# Patient Record
Sex: Female | Born: 1935
Health system: Southern US, Community
[De-identification: ages and names within clinical notes are randomized; demographics above are authoritative.]

## PROBLEM LIST (undated history)

## (undated) DIAGNOSIS — I10 Essential (primary) hypertension: Secondary | ICD-10-CM

## (undated) DIAGNOSIS — E119 Type 2 diabetes mellitus without complications: Secondary | ICD-10-CM

---

## 2016-10-28 DIAGNOSIS — E785 Hyperlipidemia, unspecified: Secondary | ICD-10-CM | POA: Insufficient documentation

## 2016-10-28 DIAGNOSIS — I10 Essential (primary) hypertension: Secondary | ICD-10-CM | POA: Insufficient documentation

## 2016-10-28 DIAGNOSIS — Z8673 Personal history of transient ischemic attack (TIA), and cerebral infarction without residual deficits: Secondary | ICD-10-CM | POA: Insufficient documentation

## 2016-11-11 DIAGNOSIS — E1140 Type 2 diabetes mellitus with diabetic neuropathy, unspecified: Secondary | ICD-10-CM | POA: Diagnosis not present

## 2016-11-11 DIAGNOSIS — E782 Mixed hyperlipidemia: Secondary | ICD-10-CM | POA: Diagnosis not present

## 2016-11-11 DIAGNOSIS — I10 Essential (primary) hypertension: Secondary | ICD-10-CM | POA: Diagnosis not present

## 2016-11-11 DIAGNOSIS — F5102 Adjustment insomnia: Secondary | ICD-10-CM | POA: Diagnosis not present

## 2016-11-16 DIAGNOSIS — E118 Type 2 diabetes mellitus with unspecified complications: Secondary | ICD-10-CM | POA: Diagnosis not present

## 2016-12-02 DIAGNOSIS — S81802A Unspecified open wound, left lower leg, initial encounter: Secondary | ICD-10-CM | POA: Diagnosis not present

## 2016-12-02 DIAGNOSIS — H6121 Impacted cerumen, right ear: Secondary | ICD-10-CM | POA: Diagnosis not present

## 2016-12-02 DIAGNOSIS — E1140 Type 2 diabetes mellitus with diabetic neuropathy, unspecified: Secondary | ICD-10-CM | POA: Diagnosis not present

## 2016-12-04 DIAGNOSIS — D485 Neoplasm of uncertain behavior of skin: Secondary | ICD-10-CM | POA: Diagnosis not present

## 2016-12-05 DIAGNOSIS — Z09 Encounter for follow-up examination after completed treatment for conditions other than malignant neoplasm: Secondary | ICD-10-CM | POA: Insufficient documentation

## 2016-12-05 DIAGNOSIS — C44729 Squamous cell carcinoma of skin of left lower limb, including hip: Secondary | ICD-10-CM | POA: Diagnosis not present

## 2016-12-11 DIAGNOSIS — C44729 Squamous cell carcinoma of skin of left lower limb, including hip: Secondary | ICD-10-CM | POA: Insufficient documentation

## 2016-12-20 DIAGNOSIS — K5909 Other constipation: Secondary | ICD-10-CM | POA: Diagnosis not present

## 2016-12-20 DIAGNOSIS — S81802A Unspecified open wound, left lower leg, initial encounter: Secondary | ICD-10-CM | POA: Diagnosis not present

## 2016-12-20 DIAGNOSIS — R21 Rash and other nonspecific skin eruption: Secondary | ICD-10-CM | POA: Diagnosis not present

## 2016-12-20 DIAGNOSIS — Z1211 Encounter for screening for malignant neoplasm of colon: Secondary | ICD-10-CM | POA: Diagnosis not present

## 2016-12-26 DIAGNOSIS — E782 Mixed hyperlipidemia: Secondary | ICD-10-CM | POA: Diagnosis not present

## 2016-12-26 DIAGNOSIS — M79676 Pain in unspecified toe(s): Secondary | ICD-10-CM | POA: Diagnosis not present

## 2016-12-26 DIAGNOSIS — E1140 Type 2 diabetes mellitus with diabetic neuropathy, unspecified: Secondary | ICD-10-CM | POA: Diagnosis not present

## 2017-01-08 DIAGNOSIS — C44729 Squamous cell carcinoma of skin of left lower limb, including hip: Secondary | ICD-10-CM | POA: Diagnosis not present

## 2017-01-20 DIAGNOSIS — R198 Other specified symptoms and signs involving the digestive system and abdomen: Secondary | ICD-10-CM | POA: Diagnosis not present

## 2017-01-20 DIAGNOSIS — E1136 Type 2 diabetes mellitus with diabetic cataract: Secondary | ICD-10-CM | POA: Diagnosis not present

## 2017-01-20 DIAGNOSIS — Z1339 Encounter for screening examination for other mental health and behavioral disorders: Secondary | ICD-10-CM | POA: Diagnosis not present

## 2017-01-20 DIAGNOSIS — T8130XA Disruption of wound, unspecified, initial encounter: Secondary | ICD-10-CM | POA: Diagnosis not present

## 2017-01-27 DIAGNOSIS — E1140 Type 2 diabetes mellitus with diabetic neuropathy, unspecified: Secondary | ICD-10-CM | POA: Diagnosis not present

## 2017-01-27 DIAGNOSIS — T8189XA Other complications of procedures, not elsewhere classified, initial encounter: Secondary | ICD-10-CM | POA: Diagnosis not present

## 2017-01-27 DIAGNOSIS — M170 Bilateral primary osteoarthritis of knee: Secondary | ICD-10-CM | POA: Diagnosis not present

## 2017-01-27 DIAGNOSIS — I10 Essential (primary) hypertension: Secondary | ICD-10-CM | POA: Diagnosis not present

## 2017-01-27 DIAGNOSIS — K219 Gastro-esophageal reflux disease without esophagitis: Secondary | ICD-10-CM | POA: Diagnosis not present

## 2017-01-27 DIAGNOSIS — R198 Other specified symptoms and signs involving the digestive system and abdomen: Secondary | ICD-10-CM | POA: Diagnosis not present

## 2017-01-27 DIAGNOSIS — T8130XA Disruption of wound, unspecified, initial encounter: Secondary | ICD-10-CM | POA: Diagnosis not present

## 2017-01-27 DIAGNOSIS — C441292 Squamous cell carcinoma of skin of left lower eyelid, including canthus: Secondary | ICD-10-CM | POA: Diagnosis not present

## 2017-01-27 DIAGNOSIS — Z8673 Personal history of transient ischemic attack (TIA), and cerebral infarction without residual deficits: Secondary | ICD-10-CM | POA: Diagnosis not present

## 2017-01-27 DIAGNOSIS — S81802A Unspecified open wound, left lower leg, initial encounter: Secondary | ICD-10-CM | POA: Diagnosis not present

## 2017-01-30 DIAGNOSIS — L570 Actinic keratosis: Secondary | ICD-10-CM | POA: Diagnosis not present

## 2017-01-30 DIAGNOSIS — C44519 Basal cell carcinoma of skin of other part of trunk: Secondary | ICD-10-CM | POA: Diagnosis not present

## 2017-01-30 DIAGNOSIS — L400 Psoriasis vulgaris: Secondary | ICD-10-CM | POA: Diagnosis not present

## 2017-02-03 DIAGNOSIS — Z48817 Encounter for surgical aftercare following surgery on the skin and subcutaneous tissue: Secondary | ICD-10-CM | POA: Diagnosis not present

## 2017-02-03 DIAGNOSIS — Z85828 Personal history of other malignant neoplasm of skin: Secondary | ICD-10-CM | POA: Diagnosis not present

## 2017-02-03 DIAGNOSIS — H9209 Otalgia, unspecified ear: Secondary | ICD-10-CM | POA: Diagnosis not present

## 2017-02-03 DIAGNOSIS — S81802A Unspecified open wound, left lower leg, initial encounter: Secondary | ICD-10-CM | POA: Diagnosis not present

## 2017-02-03 DIAGNOSIS — H6120 Impacted cerumen, unspecified ear: Secondary | ICD-10-CM | POA: Diagnosis not present

## 2017-02-03 DIAGNOSIS — H61891 Other specified disorders of right external ear: Secondary | ICD-10-CM | POA: Diagnosis not present

## 2017-02-03 DIAGNOSIS — I872 Venous insufficiency (chronic) (peripheral): Secondary | ICD-10-CM | POA: Diagnosis not present

## 2017-02-03 DIAGNOSIS — L97822 Non-pressure chronic ulcer of other part of left lower leg with fat layer exposed: Secondary | ICD-10-CM | POA: Diagnosis not present

## 2017-02-03 DIAGNOSIS — Z4889 Encounter for other specified surgical aftercare: Secondary | ICD-10-CM | POA: Diagnosis not present

## 2017-02-13 DIAGNOSIS — L821 Other seborrheic keratosis: Secondary | ICD-10-CM | POA: Diagnosis not present

## 2017-02-13 DIAGNOSIS — C44519 Basal cell carcinoma of skin of other part of trunk: Secondary | ICD-10-CM | POA: Diagnosis not present

## 2017-02-13 DIAGNOSIS — D485 Neoplasm of uncertain behavior of skin: Secondary | ICD-10-CM | POA: Diagnosis not present

## 2017-02-15 DIAGNOSIS — E118 Type 2 diabetes mellitus with unspecified complications: Secondary | ICD-10-CM | POA: Diagnosis not present

## 2017-02-17 DIAGNOSIS — H43813 Vitreous degeneration, bilateral: Secondary | ICD-10-CM | POA: Diagnosis not present

## 2017-02-17 DIAGNOSIS — E113293 Type 2 diabetes mellitus with mild nonproliferative diabetic retinopathy without macular edema, bilateral: Secondary | ICD-10-CM | POA: Diagnosis not present

## 2017-02-19 DIAGNOSIS — C44519 Basal cell carcinoma of skin of other part of trunk: Secondary | ICD-10-CM | POA: Diagnosis not present

## 2017-02-19 DIAGNOSIS — S2190XA Unspecified open wound of unspecified part of thorax, initial encounter: Secondary | ICD-10-CM | POA: Diagnosis not present

## 2017-02-27 DIAGNOSIS — C44329 Squamous cell carcinoma of skin of other parts of face: Secondary | ICD-10-CM | POA: Diagnosis not present

## 2017-02-27 DIAGNOSIS — L821 Other seborrheic keratosis: Secondary | ICD-10-CM | POA: Diagnosis not present

## 2017-02-27 DIAGNOSIS — L720 Epidermal cyst: Secondary | ICD-10-CM | POA: Diagnosis not present

## 2017-02-27 DIAGNOSIS — L570 Actinic keratosis: Secondary | ICD-10-CM | POA: Diagnosis not present

## 2017-02-27 DIAGNOSIS — D485 Neoplasm of uncertain behavior of skin: Secondary | ICD-10-CM | POA: Diagnosis not present

## 2017-02-27 DIAGNOSIS — L853 Xerosis cutis: Secondary | ICD-10-CM | POA: Diagnosis not present

## 2017-02-27 DIAGNOSIS — L579 Skin changes due to chronic exposure to nonionizing radiation, unspecified: Secondary | ICD-10-CM | POA: Diagnosis not present

## 2017-02-27 DIAGNOSIS — D0330 Melanoma in situ of unspecified part of face: Secondary | ICD-10-CM | POA: Diagnosis not present

## 2017-03-11 DIAGNOSIS — G459 Transient cerebral ischemic attack, unspecified: Secondary | ICD-10-CM | POA: Diagnosis not present

## 2017-03-11 DIAGNOSIS — R609 Edema, unspecified: Secondary | ICD-10-CM | POA: Diagnosis not present

## 2017-03-11 DIAGNOSIS — E781 Pure hyperglyceridemia: Secondary | ICD-10-CM | POA: Diagnosis not present

## 2017-03-13 DIAGNOSIS — C44320 Squamous cell carcinoma of skin of unspecified parts of face: Secondary | ICD-10-CM | POA: Diagnosis not present

## 2017-03-13 DIAGNOSIS — C44329 Squamous cell carcinoma of skin of other parts of face: Secondary | ICD-10-CM | POA: Diagnosis not present

## 2017-03-13 DIAGNOSIS — C4442 Squamous cell carcinoma of skin of scalp and neck: Secondary | ICD-10-CM | POA: Diagnosis not present

## 2017-03-21 DIAGNOSIS — D0339 Melanoma in situ of other parts of face: Secondary | ICD-10-CM | POA: Diagnosis not present

## 2017-03-25 DIAGNOSIS — C4339 Malignant melanoma of other parts of face: Secondary | ICD-10-CM | POA: Diagnosis not present

## 2017-03-25 DIAGNOSIS — D0339 Melanoma in situ of other parts of face: Secondary | ICD-10-CM | POA: Diagnosis not present

## 2017-04-01 DIAGNOSIS — T7840XA Allergy, unspecified, initial encounter: Secondary | ICD-10-CM | POA: Diagnosis not present

## 2017-04-01 DIAGNOSIS — I160 Hypertensive urgency: Secondary | ICD-10-CM | POA: Diagnosis not present

## 2017-04-01 DIAGNOSIS — E1165 Type 2 diabetes mellitus with hyperglycemia: Secondary | ICD-10-CM | POA: Diagnosis not present

## 2017-04-01 DIAGNOSIS — E782 Mixed hyperlipidemia: Secondary | ICD-10-CM | POA: Diagnosis not present

## 2017-04-01 DIAGNOSIS — R079 Chest pain, unspecified: Secondary | ICD-10-CM | POA: Diagnosis not present

## 2017-04-01 DIAGNOSIS — Z79899 Other long term (current) drug therapy: Secondary | ICD-10-CM | POA: Diagnosis not present

## 2017-04-01 DIAGNOSIS — R9082 White matter disease, unspecified: Secondary | ICD-10-CM | POA: Diagnosis not present

## 2017-04-01 DIAGNOSIS — I10 Essential (primary) hypertension: Secondary | ICD-10-CM | POA: Diagnosis not present

## 2017-04-01 DIAGNOSIS — R51 Headache: Secondary | ICD-10-CM | POA: Diagnosis not present

## 2017-04-03 DIAGNOSIS — R011 Cardiac murmur, unspecified: Secondary | ICD-10-CM | POA: Diagnosis not present

## 2017-04-03 DIAGNOSIS — R9431 Abnormal electrocardiogram [ECG] [EKG]: Secondary | ICD-10-CM | POA: Diagnosis not present

## 2017-04-03 DIAGNOSIS — E119 Type 2 diabetes mellitus without complications: Secondary | ICD-10-CM | POA: Diagnosis not present

## 2017-04-03 DIAGNOSIS — I10 Essential (primary) hypertension: Secondary | ICD-10-CM | POA: Diagnosis not present

## 2017-04-03 DIAGNOSIS — E7800 Pure hypercholesterolemia, unspecified: Secondary | ICD-10-CM | POA: Diagnosis not present

## 2017-04-17 DIAGNOSIS — M79606 Pain in leg, unspecified: Secondary | ICD-10-CM | POA: Diagnosis not present

## 2017-04-17 DIAGNOSIS — R011 Cardiac murmur, unspecified: Secondary | ICD-10-CM | POA: Diagnosis not present

## 2017-04-17 DIAGNOSIS — I739 Peripheral vascular disease, unspecified: Secondary | ICD-10-CM | POA: Diagnosis not present

## 2017-04-17 DIAGNOSIS — R0989 Other specified symptoms and signs involving the circulatory and respiratory systems: Secondary | ICD-10-CM | POA: Diagnosis not present

## 2017-04-18 DIAGNOSIS — E7800 Pure hypercholesterolemia, unspecified: Secondary | ICD-10-CM | POA: Diagnosis not present

## 2017-04-18 DIAGNOSIS — I10 Essential (primary) hypertension: Secondary | ICD-10-CM | POA: Diagnosis not present

## 2017-04-18 DIAGNOSIS — L400 Psoriasis vulgaris: Secondary | ICD-10-CM | POA: Diagnosis not present

## 2017-04-18 DIAGNOSIS — D485 Neoplasm of uncertain behavior of skin: Secondary | ICD-10-CM | POA: Diagnosis not present

## 2017-04-18 DIAGNOSIS — R011 Cardiac murmur, unspecified: Secondary | ICD-10-CM | POA: Diagnosis not present

## 2017-04-18 DIAGNOSIS — R9431 Abnormal electrocardiogram [ECG] [EKG]: Secondary | ICD-10-CM | POA: Diagnosis not present

## 2017-04-18 DIAGNOSIS — E119 Type 2 diabetes mellitus without complications: Secondary | ICD-10-CM | POA: Diagnosis not present

## 2017-04-18 DIAGNOSIS — L821 Other seborrheic keratosis: Secondary | ICD-10-CM | POA: Diagnosis not present

## 2017-04-18 DIAGNOSIS — C44729 Squamous cell carcinoma of skin of left lower limb, including hip: Secondary | ICD-10-CM | POA: Diagnosis not present

## 2017-05-01 DIAGNOSIS — C44729 Squamous cell carcinoma of skin of left lower limb, including hip: Secondary | ICD-10-CM | POA: Diagnosis not present

## 2017-05-12 DIAGNOSIS — Z79899 Other long term (current) drug therapy: Secondary | ICD-10-CM | POA: Diagnosis not present

## 2017-05-12 DIAGNOSIS — R05 Cough: Secondary | ICD-10-CM | POA: Diagnosis not present

## 2017-05-12 DIAGNOSIS — I10 Essential (primary) hypertension: Secondary | ICD-10-CM | POA: Diagnosis not present

## 2017-05-12 DIAGNOSIS — Z794 Long term (current) use of insulin: Secondary | ICD-10-CM | POA: Diagnosis not present

## 2017-05-12 DIAGNOSIS — R531 Weakness: Secondary | ICD-10-CM | POA: Diagnosis not present

## 2017-05-12 DIAGNOSIS — R079 Chest pain, unspecified: Secondary | ICD-10-CM | POA: Diagnosis not present

## 2017-05-12 DIAGNOSIS — E119 Type 2 diabetes mellitus without complications: Secondary | ICD-10-CM | POA: Diagnosis not present

## 2017-05-15 DIAGNOSIS — E7800 Pure hypercholesterolemia, unspecified: Secondary | ICD-10-CM | POA: Diagnosis not present

## 2017-05-15 DIAGNOSIS — R194 Change in bowel habit: Secondary | ICD-10-CM | POA: Diagnosis not present

## 2017-05-15 DIAGNOSIS — I10 Essential (primary) hypertension: Secondary | ICD-10-CM | POA: Diagnosis not present

## 2017-05-15 DIAGNOSIS — R011 Cardiac murmur, unspecified: Secondary | ICD-10-CM | POA: Diagnosis not present

## 2017-05-15 DIAGNOSIS — K219 Gastro-esophageal reflux disease without esophagitis: Secondary | ICD-10-CM | POA: Diagnosis not present

## 2017-05-15 DIAGNOSIS — K5909 Other constipation: Secondary | ICD-10-CM | POA: Diagnosis not present

## 2017-05-15 DIAGNOSIS — K9289 Other specified diseases of the digestive system: Secondary | ICD-10-CM | POA: Diagnosis not present

## 2017-05-15 DIAGNOSIS — R9431 Abnormal electrocardiogram [ECG] [EKG]: Secondary | ICD-10-CM | POA: Diagnosis not present

## 2017-05-15 DIAGNOSIS — R112 Nausea with vomiting, unspecified: Secondary | ICD-10-CM | POA: Diagnosis not present

## 2017-05-15 DIAGNOSIS — M79606 Pain in leg, unspecified: Secondary | ICD-10-CM | POA: Diagnosis not present

## 2017-05-16 DIAGNOSIS — R109 Unspecified abdominal pain: Secondary | ICD-10-CM | POA: Diagnosis not present

## 2017-05-16 DIAGNOSIS — K2970 Gastritis, unspecified, without bleeding: Secondary | ICD-10-CM | POA: Diagnosis not present

## 2017-05-16 DIAGNOSIS — K2900 Acute gastritis without bleeding: Secondary | ICD-10-CM | POA: Diagnosis not present

## 2017-05-16 DIAGNOSIS — I10 Essential (primary) hypertension: Secondary | ICD-10-CM | POA: Diagnosis not present

## 2017-05-16 DIAGNOSIS — E119 Type 2 diabetes mellitus without complications: Secondary | ICD-10-CM | POA: Diagnosis not present

## 2017-05-16 DIAGNOSIS — Z794 Long term (current) use of insulin: Secondary | ICD-10-CM | POA: Diagnosis not present

## 2017-05-16 DIAGNOSIS — N200 Calculus of kidney: Secondary | ICD-10-CM | POA: Diagnosis not present

## 2017-05-17 DIAGNOSIS — E118 Type 2 diabetes mellitus with unspecified complications: Secondary | ICD-10-CM | POA: Diagnosis not present

## 2017-05-26 DIAGNOSIS — L400 Psoriasis vulgaris: Secondary | ICD-10-CM | POA: Diagnosis not present

## 2017-05-26 DIAGNOSIS — L570 Actinic keratosis: Secondary | ICD-10-CM | POA: Diagnosis not present

## 2017-06-14 DIAGNOSIS — R42 Dizziness and giddiness: Secondary | ICD-10-CM | POA: Diagnosis not present

## 2017-06-14 DIAGNOSIS — E119 Type 2 diabetes mellitus without complications: Secondary | ICD-10-CM | POA: Diagnosis not present

## 2017-06-14 DIAGNOSIS — I10 Essential (primary) hypertension: Secondary | ICD-10-CM | POA: Diagnosis not present

## 2017-06-14 DIAGNOSIS — Z794 Long term (current) use of insulin: Secondary | ICD-10-CM | POA: Diagnosis not present

## 2017-06-14 DIAGNOSIS — R350 Frequency of micturition: Secondary | ICD-10-CM | POA: Diagnosis not present

## 2017-06-14 DIAGNOSIS — Z79899 Other long term (current) drug therapy: Secondary | ICD-10-CM | POA: Diagnosis not present

## 2017-06-25 DIAGNOSIS — R198 Other specified symptoms and signs involving the digestive system and abdomen: Secondary | ICD-10-CM | POA: Diagnosis not present

## 2017-06-25 DIAGNOSIS — M5431 Sciatica, right side: Secondary | ICD-10-CM | POA: Diagnosis not present

## 2017-06-25 DIAGNOSIS — L989 Disorder of the skin and subcutaneous tissue, unspecified: Secondary | ICD-10-CM | POA: Diagnosis not present

## 2017-06-25 DIAGNOSIS — L409 Psoriasis, unspecified: Secondary | ICD-10-CM | POA: Diagnosis not present

## 2017-06-25 DIAGNOSIS — K921 Melena: Secondary | ICD-10-CM | POA: Diagnosis not present

## 2017-07-01 DIAGNOSIS — L400 Psoriasis vulgaris: Secondary | ICD-10-CM | POA: Diagnosis not present

## 2017-07-01 DIAGNOSIS — C44529 Squamous cell carcinoma of skin of other part of trunk: Secondary | ICD-10-CM | POA: Diagnosis not present

## 2017-07-01 DIAGNOSIS — D045 Carcinoma in situ of skin of trunk: Secondary | ICD-10-CM | POA: Diagnosis not present

## 2017-07-01 DIAGNOSIS — L821 Other seborrheic keratosis: Secondary | ICD-10-CM | POA: Diagnosis not present

## 2017-07-08 DIAGNOSIS — Z1211 Encounter for screening for malignant neoplasm of colon: Secondary | ICD-10-CM | POA: Diagnosis not present

## 2017-07-09 DIAGNOSIS — I10 Essential (primary) hypertension: Secondary | ICD-10-CM | POA: Diagnosis not present

## 2017-07-09 DIAGNOSIS — K529 Noninfective gastroenteritis and colitis, unspecified: Secondary | ICD-10-CM | POA: Diagnosis not present

## 2017-07-09 DIAGNOSIS — E1165 Type 2 diabetes mellitus with hyperglycemia: Secondary | ICD-10-CM | POA: Diagnosis not present

## 2017-07-10 DIAGNOSIS — K529 Noninfective gastroenteritis and colitis, unspecified: Secondary | ICD-10-CM | POA: Diagnosis not present

## 2017-07-22 DIAGNOSIS — R197 Diarrhea, unspecified: Secondary | ICD-10-CM | POA: Diagnosis not present

## 2017-07-23 DIAGNOSIS — R197 Diarrhea, unspecified: Secondary | ICD-10-CM | POA: Diagnosis not present

## 2017-07-30 DIAGNOSIS — D045 Carcinoma in situ of skin of trunk: Secondary | ICD-10-CM | POA: Diagnosis not present

## 2017-08-08 DIAGNOSIS — M1712 Unilateral primary osteoarthritis, left knee: Secondary | ICD-10-CM | POA: Diagnosis not present

## 2017-08-08 DIAGNOSIS — T8189XA Other complications of procedures, not elsewhere classified, initial encounter: Secondary | ICD-10-CM | POA: Diagnosis not present

## 2017-08-08 DIAGNOSIS — I10 Essential (primary) hypertension: Secondary | ICD-10-CM | POA: Diagnosis not present

## 2017-08-08 DIAGNOSIS — G629 Polyneuropathy, unspecified: Secondary | ICD-10-CM | POA: Diagnosis not present

## 2017-08-12 DIAGNOSIS — Z85820 Personal history of malignant melanoma of skin: Secondary | ICD-10-CM | POA: Diagnosis not present

## 2017-08-12 DIAGNOSIS — C44329 Squamous cell carcinoma of skin of other parts of face: Secondary | ICD-10-CM | POA: Diagnosis not present

## 2017-08-12 DIAGNOSIS — L570 Actinic keratosis: Secondary | ICD-10-CM | POA: Diagnosis not present

## 2017-08-12 DIAGNOSIS — L821 Other seborrheic keratosis: Secondary | ICD-10-CM | POA: Diagnosis not present

## 2017-08-12 DIAGNOSIS — D045 Carcinoma in situ of skin of trunk: Secondary | ICD-10-CM | POA: Diagnosis not present

## 2017-08-14 DIAGNOSIS — M1712 Unilateral primary osteoarthritis, left knee: Secondary | ICD-10-CM | POA: Diagnosis not present

## 2017-08-16 DIAGNOSIS — E118 Type 2 diabetes mellitus with unspecified complications: Secondary | ICD-10-CM | POA: Diagnosis not present

## 2017-08-27 DIAGNOSIS — M25562 Pain in left knee: Secondary | ICD-10-CM | POA: Diagnosis not present

## 2017-08-27 DIAGNOSIS — R2689 Other abnormalities of gait and mobility: Secondary | ICD-10-CM | POA: Diagnosis not present

## 2017-08-27 DIAGNOSIS — M1712 Unilateral primary osteoarthritis, left knee: Secondary | ICD-10-CM | POA: Diagnosis not present

## 2017-09-15 DIAGNOSIS — E1165 Type 2 diabetes mellitus with hyperglycemia: Secondary | ICD-10-CM | POA: Diagnosis not present

## 2017-09-18 DIAGNOSIS — M1712 Unilateral primary osteoarthritis, left knee: Secondary | ICD-10-CM | POA: Diagnosis not present

## 2017-10-06 DIAGNOSIS — N3000 Acute cystitis without hematuria: Secondary | ICD-10-CM | POA: Diagnosis not present

## 2017-10-21 DIAGNOSIS — H25812 Combined forms of age-related cataract, left eye: Secondary | ICD-10-CM | POA: Diagnosis not present

## 2017-10-22 DIAGNOSIS — J9811 Atelectasis: Secondary | ICD-10-CM | POA: Diagnosis not present

## 2017-10-22 DIAGNOSIS — E119 Type 2 diabetes mellitus without complications: Secondary | ICD-10-CM | POA: Diagnosis not present

## 2017-10-22 DIAGNOSIS — R7309 Other abnormal glucose: Secondary | ICD-10-CM | POA: Diagnosis not present

## 2017-10-22 DIAGNOSIS — R7989 Other specified abnormal findings of blood chemistry: Secondary | ICD-10-CM | POA: Diagnosis not present

## 2017-10-22 DIAGNOSIS — I700 Atherosclerosis of aorta: Secondary | ICD-10-CM | POA: Diagnosis not present

## 2017-10-22 DIAGNOSIS — I10 Essential (primary) hypertension: Secondary | ICD-10-CM | POA: Diagnosis not present

## 2017-10-22 DIAGNOSIS — R251 Tremor, unspecified: Secondary | ICD-10-CM | POA: Diagnosis not present

## 2017-10-22 DIAGNOSIS — R252 Cramp and spasm: Secondary | ICD-10-CM | POA: Diagnosis not present

## 2017-10-23 DIAGNOSIS — R7989 Other specified abnormal findings of blood chemistry: Secondary | ICD-10-CM | POA: Diagnosis not present

## 2017-10-27 DIAGNOSIS — G373 Acute transverse myelitis in demyelinating disease of central nervous system: Secondary | ICD-10-CM | POA: Diagnosis not present

## 2017-10-27 DIAGNOSIS — E1165 Type 2 diabetes mellitus with hyperglycemia: Secondary | ICD-10-CM | POA: Diagnosis not present

## 2017-10-27 DIAGNOSIS — I10 Essential (primary) hypertension: Secondary | ICD-10-CM | POA: Diagnosis not present

## 2017-11-14 DIAGNOSIS — N3000 Acute cystitis without hematuria: Secondary | ICD-10-CM | POA: Diagnosis not present

## 2017-11-14 DIAGNOSIS — Z79899 Other long term (current) drug therapy: Secondary | ICD-10-CM | POA: Diagnosis not present

## 2017-11-14 DIAGNOSIS — E7800 Pure hypercholesterolemia, unspecified: Secondary | ICD-10-CM | POA: Diagnosis not present

## 2017-11-14 DIAGNOSIS — D510 Vitamin B12 deficiency anemia due to intrinsic factor deficiency: Secondary | ICD-10-CM | POA: Diagnosis not present

## 2017-11-14 DIAGNOSIS — E1165 Type 2 diabetes mellitus with hyperglycemia: Secondary | ICD-10-CM | POA: Diagnosis not present

## 2017-11-19 DIAGNOSIS — M48061 Spinal stenosis, lumbar region without neurogenic claudication: Secondary | ICD-10-CM | POA: Diagnosis not present

## 2017-11-19 DIAGNOSIS — N3000 Acute cystitis without hematuria: Secondary | ICD-10-CM | POA: Diagnosis not present

## 2017-11-19 DIAGNOSIS — G373 Acute transverse myelitis in demyelinating disease of central nervous system: Secondary | ICD-10-CM | POA: Diagnosis not present

## 2017-11-19 DIAGNOSIS — M4804 Spinal stenosis, thoracic region: Secondary | ICD-10-CM | POA: Diagnosis not present

## 2017-11-19 DIAGNOSIS — E1165 Type 2 diabetes mellitus with hyperglycemia: Secondary | ICD-10-CM | POA: Diagnosis not present

## 2017-11-19 DIAGNOSIS — M5126 Other intervertebral disc displacement, lumbar region: Secondary | ICD-10-CM | POA: Diagnosis not present

## 2017-11-19 DIAGNOSIS — M47814 Spondylosis without myelopathy or radiculopathy, thoracic region: Secondary | ICD-10-CM | POA: Diagnosis not present

## 2017-11-19 DIAGNOSIS — M542 Cervicalgia: Secondary | ICD-10-CM | POA: Diagnosis not present

## 2017-11-20 DIAGNOSIS — C44622 Squamous cell carcinoma of skin of right upper limb, including shoulder: Secondary | ICD-10-CM | POA: Diagnosis not present

## 2017-11-20 DIAGNOSIS — L400 Psoriasis vulgaris: Secondary | ICD-10-CM | POA: Diagnosis not present

## 2017-11-20 DIAGNOSIS — L821 Other seborrheic keratosis: Secondary | ICD-10-CM | POA: Diagnosis not present

## 2017-11-20 DIAGNOSIS — L820 Inflamed seborrheic keratosis: Secondary | ICD-10-CM | POA: Diagnosis not present

## 2017-11-20 DIAGNOSIS — L578 Other skin changes due to chronic exposure to nonionizing radiation: Secondary | ICD-10-CM | POA: Diagnosis not present

## 2017-11-24 DIAGNOSIS — M48061 Spinal stenosis, lumbar region without neurogenic claudication: Secondary | ICD-10-CM | POA: Diagnosis not present

## 2017-11-24 DIAGNOSIS — M9981 Other biomechanical lesions of cervical region: Secondary | ICD-10-CM | POA: Diagnosis not present

## 2017-11-24 DIAGNOSIS — E1165 Type 2 diabetes mellitus with hyperglycemia: Secondary | ICD-10-CM | POA: Diagnosis not present

## 2017-12-16 DIAGNOSIS — M4316 Spondylolisthesis, lumbar region: Secondary | ICD-10-CM | POA: Diagnosis not present

## 2017-12-16 DIAGNOSIS — M48062 Spinal stenosis, lumbar region with neurogenic claudication: Secondary | ICD-10-CM | POA: Diagnosis not present

## 2017-12-16 DIAGNOSIS — M4802 Spinal stenosis, cervical region: Secondary | ICD-10-CM | POA: Diagnosis not present

## 2017-12-16 DIAGNOSIS — M4726 Other spondylosis with radiculopathy, lumbar region: Secondary | ICD-10-CM | POA: Diagnosis not present

## 2017-12-24 DIAGNOSIS — E1165 Type 2 diabetes mellitus with hyperglycemia: Secondary | ICD-10-CM | POA: Diagnosis not present

## 2017-12-24 DIAGNOSIS — L4050 Arthropathic psoriasis, unspecified: Secondary | ICD-10-CM | POA: Diagnosis not present

## 2017-12-24 DIAGNOSIS — M48061 Spinal stenosis, lumbar region without neurogenic claudication: Secondary | ICD-10-CM | POA: Diagnosis not present

## 2017-12-24 DIAGNOSIS — L400 Psoriasis vulgaris: Secondary | ICD-10-CM | POA: Diagnosis not present

## 2018-01-01 DIAGNOSIS — E1165 Type 2 diabetes mellitus with hyperglycemia: Secondary | ICD-10-CM | POA: Diagnosis not present

## 2018-01-19 DIAGNOSIS — M48062 Spinal stenosis, lumbar region with neurogenic claudication: Secondary | ICD-10-CM | POA: Diagnosis not present

## 2018-01-19 DIAGNOSIS — I10 Essential (primary) hypertension: Secondary | ICD-10-CM | POA: Diagnosis not present

## 2018-01-19 DIAGNOSIS — M4804 Spinal stenosis, thoracic region: Secondary | ICD-10-CM | POA: Diagnosis not present

## 2018-01-20 DIAGNOSIS — K602 Anal fissure, unspecified: Secondary | ICD-10-CM | POA: Diagnosis not present

## 2018-01-20 DIAGNOSIS — L409 Psoriasis, unspecified: Secondary | ICD-10-CM | POA: Diagnosis not present

## 2018-02-09 DIAGNOSIS — L720 Epidermal cyst: Secondary | ICD-10-CM | POA: Diagnosis not present

## 2018-02-09 DIAGNOSIS — L400 Psoriasis vulgaris: Secondary | ICD-10-CM | POA: Diagnosis not present

## 2018-02-16 ENCOUNTER — Other Ambulatory Visit: Payer: Self-pay | Admitting: Neurological Surgery

## 2018-02-16 DIAGNOSIS — M4316 Spondylolisthesis, lumbar region: Secondary | ICD-10-CM | POA: Diagnosis not present

## 2018-02-16 DIAGNOSIS — M48062 Spinal stenosis, lumbar region with neurogenic claudication: Secondary | ICD-10-CM | POA: Diagnosis not present

## 2018-03-09 ENCOUNTER — Other Ambulatory Visit (HOSPITAL_COMMUNITY): Payer: Medicare Other

## 2018-03-13 ENCOUNTER — Ambulatory Visit: Admit: 2018-03-13 | Payer: Medicare Other | Admitting: Neurological Surgery

## 2018-03-13 SURGERY — LUMBAR LAMINECTOMY/DECOMPRESSION MICRODISCECTOMY 1 LEVEL
Anesthesia: General | Site: Back | Laterality: Bilateral

## 2018-03-23 DIAGNOSIS — E113293 Type 2 diabetes mellitus with mild nonproliferative diabetic retinopathy without macular edema, bilateral: Secondary | ICD-10-CM | POA: Diagnosis not present

## 2018-03-23 DIAGNOSIS — H2589 Other age-related cataract: Secondary | ICD-10-CM | POA: Diagnosis not present

## 2018-04-10 DIAGNOSIS — I10 Essential (primary) hypertension: Secondary | ICD-10-CM | POA: Diagnosis not present

## 2018-04-10 DIAGNOSIS — E1165 Type 2 diabetes mellitus with hyperglycemia: Secondary | ICD-10-CM | POA: Diagnosis not present

## 2018-04-10 DIAGNOSIS — R9431 Abnormal electrocardiogram [ECG] [EKG]: Secondary | ICD-10-CM | POA: Diagnosis not present

## 2018-04-16 DIAGNOSIS — Z1231 Encounter for screening mammogram for malignant neoplasm of breast: Secondary | ICD-10-CM | POA: Diagnosis not present

## 2018-04-17 DIAGNOSIS — Z85828 Personal history of other malignant neoplasm of skin: Secondary | ICD-10-CM | POA: Diagnosis not present

## 2018-04-17 DIAGNOSIS — Z85820 Personal history of malignant melanoma of skin: Secondary | ICD-10-CM | POA: Diagnosis not present

## 2018-04-17 DIAGNOSIS — L400 Psoriasis vulgaris: Secondary | ICD-10-CM | POA: Diagnosis not present

## 2018-04-22 DIAGNOSIS — N390 Urinary tract infection, site not specified: Secondary | ICD-10-CM | POA: Diagnosis not present

## 2018-04-22 DIAGNOSIS — I10 Essential (primary) hypertension: Secondary | ICD-10-CM | POA: Diagnosis not present

## 2018-04-22 DIAGNOSIS — R9431 Abnormal electrocardiogram [ECG] [EKG]: Secondary | ICD-10-CM | POA: Diagnosis not present

## 2018-04-22 DIAGNOSIS — M2550 Pain in unspecified joint: Secondary | ICD-10-CM | POA: Diagnosis not present

## 2018-04-22 DIAGNOSIS — R8290 Unspecified abnormal findings in urine: Secondary | ICD-10-CM | POA: Diagnosis not present

## 2018-04-22 DIAGNOSIS — E1165 Type 2 diabetes mellitus with hyperglycemia: Secondary | ICD-10-CM | POA: Diagnosis not present

## 2018-05-01 DIAGNOSIS — I10 Essential (primary) hypertension: Secondary | ICD-10-CM | POA: Diagnosis not present

## 2018-05-01 DIAGNOSIS — E1165 Type 2 diabetes mellitus with hyperglycemia: Secondary | ICD-10-CM | POA: Diagnosis not present

## 2018-05-07 DIAGNOSIS — M1712 Unilateral primary osteoarthritis, left knee: Secondary | ICD-10-CM | POA: Diagnosis not present

## 2018-06-02 DIAGNOSIS — E1165 Type 2 diabetes mellitus with hyperglycemia: Secondary | ICD-10-CM | POA: Diagnosis not present

## 2018-06-02 DIAGNOSIS — I10 Essential (primary) hypertension: Secondary | ICD-10-CM | POA: Diagnosis not present

## 2018-07-02 DIAGNOSIS — E785 Hyperlipidemia, unspecified: Secondary | ICD-10-CM | POA: Diagnosis not present

## 2018-07-02 DIAGNOSIS — I10 Essential (primary) hypertension: Secondary | ICD-10-CM | POA: Diagnosis not present

## 2018-07-03 DIAGNOSIS — J988 Other specified respiratory disorders: Secondary | ICD-10-CM | POA: Diagnosis not present

## 2018-07-03 DIAGNOSIS — K5900 Constipation, unspecified: Secondary | ICD-10-CM | POA: Diagnosis not present

## 2018-07-05 DIAGNOSIS — R05 Cough: Secondary | ICD-10-CM | POA: Diagnosis not present

## 2018-07-05 DIAGNOSIS — Z03818 Encounter for observation for suspected exposure to other biological agents ruled out: Secondary | ICD-10-CM | POA: Diagnosis not present

## 2018-07-08 DIAGNOSIS — H1010 Acute atopic conjunctivitis, unspecified eye: Secondary | ICD-10-CM | POA: Diagnosis not present

## 2018-07-08 DIAGNOSIS — J189 Pneumonia, unspecified organism: Secondary | ICD-10-CM | POA: Diagnosis not present

## 2018-07-08 DIAGNOSIS — J309 Allergic rhinitis, unspecified: Secondary | ICD-10-CM | POA: Diagnosis not present

## 2018-07-16 DIAGNOSIS — J189 Pneumonia, unspecified organism: Secondary | ICD-10-CM | POA: Diagnosis not present

## 2018-07-16 DIAGNOSIS — J309 Allergic rhinitis, unspecified: Secondary | ICD-10-CM | POA: Diagnosis not present

## 2018-07-16 DIAGNOSIS — R197 Diarrhea, unspecified: Secondary | ICD-10-CM | POA: Diagnosis not present

## 2018-07-16 DIAGNOSIS — H1010 Acute atopic conjunctivitis, unspecified eye: Secondary | ICD-10-CM | POA: Diagnosis not present

## 2018-07-31 DIAGNOSIS — I10 Essential (primary) hypertension: Secondary | ICD-10-CM | POA: Diagnosis not present

## 2018-07-31 DIAGNOSIS — M1990 Unspecified osteoarthritis, unspecified site: Secondary | ICD-10-CM | POA: Diagnosis not present

## 2018-07-31 DIAGNOSIS — E785 Hyperlipidemia, unspecified: Secondary | ICD-10-CM | POA: Diagnosis not present

## 2018-07-31 DIAGNOSIS — Z0000 Encounter for general adult medical examination without abnormal findings: Secondary | ICD-10-CM | POA: Diagnosis not present

## 2018-07-31 DIAGNOSIS — E1165 Type 2 diabetes mellitus with hyperglycemia: Secondary | ICD-10-CM | POA: Diagnosis not present

## 2018-08-01 DIAGNOSIS — E1165 Type 2 diabetes mellitus with hyperglycemia: Secondary | ICD-10-CM | POA: Diagnosis not present

## 2018-08-01 DIAGNOSIS — M1990 Unspecified osteoarthritis, unspecified site: Secondary | ICD-10-CM | POA: Diagnosis not present

## 2018-08-11 ENCOUNTER — Other Ambulatory Visit: Payer: Self-pay

## 2018-08-11 ENCOUNTER — Encounter: Payer: Self-pay | Admitting: Podiatry

## 2018-08-11 ENCOUNTER — Ambulatory Visit: Payer: Medicare Other | Admitting: Podiatry

## 2018-08-11 VITALS — Temp 96.6°F | Resp 16

## 2018-08-11 DIAGNOSIS — B351 Tinea unguium: Secondary | ICD-10-CM | POA: Diagnosis not present

## 2018-08-11 DIAGNOSIS — E1142 Type 2 diabetes mellitus with diabetic polyneuropathy: Secondary | ICD-10-CM | POA: Diagnosis not present

## 2018-08-11 DIAGNOSIS — M79609 Pain in unspecified limb: Secondary | ICD-10-CM | POA: Diagnosis not present

## 2018-08-11 DIAGNOSIS — E1169 Type 2 diabetes mellitus with other specified complication: Secondary | ICD-10-CM

## 2018-08-11 MED ORDER — FLUCONAZOLE 150 MG PO TABS: 150.0000 mg | ORAL_TABLET | ORAL | 1 refills | Status: AC

## 2018-08-11 NOTE — Progress Notes (Signed)
   Subjective:    Patient ID: Terri Martin, female    DOB: [birth year], 83 y.o.   MRN: [id number]  HPI    Review of Systems  All other systems reviewed and are negative.      Objective:   Physical Exam        Assessment & Plan:

## 2018-08-13 DIAGNOSIS — K602 Anal fissure, unspecified: Secondary | ICD-10-CM | POA: Diagnosis not present

## 2018-08-13 DIAGNOSIS — R509 Fever, unspecified: Secondary | ICD-10-CM | POA: Diagnosis not present

## 2018-08-13 DIAGNOSIS — R05 Cough: Secondary | ICD-10-CM | POA: Diagnosis not present

## 2018-08-31 NOTE — Progress Notes (Signed)
  Subjective:  Patient ID: Terri Martin, female    DOB: May 25, 1935,  MRN: [id number]  Chief Complaint  Patient presents with  . Nail Problem    BL hallux and 3rd toenails and Lt 2nd toenail thickening and disocloration x 3 mo; no pain -w/ BL feet burning -pt states  her Lt 2nd and 3rd toenials bruised since she hit them agains her walker     83 y.o. female presents  for diabetic foot care. Last AMBS was unkown. Reports numbness and tingling in their feet. Reports cramping in legs and thighs.  Review of Systems: Negative except as noted in the HPI. Denies N/V/F/Ch.  No past medical history on file.  Current Outpatient Medications:  .  ALPRAZolam (XANAX) 1 MG tablet, Take 1 mg by mouth at bedtime as needed for anxiety., Disp: , Rfl:  .  insulin lispro (HUMALOG) 100 UNIT/ML cartridge, Inject into the skin 3 (three) times daily with meals., Disp: , Rfl:  .  lisinopril-hydrochlorothiazide (ZESTORETIC) 10-12.5 MG tablet, Take 1 tablet by mouth daily., Disp: , Rfl:  .  rosuvastatin (CRESTOR) 10 MG tablet, Take 10 mg by mouth daily., Disp: , Rfl:  .  fluconazole (DIFLUCAN) 150 MG tablet, Take 1 tablet (150 mg total) by mouth once a week., Disp: 6 tablet, Rfl: 1 .  pramoxine-hydrocortisone (PROCTOCREAM-HC) 1-1 % rectal cream, APPLY TO AFFECTED AREA AS NEEDED, Disp: , Rfl:   Social History   Tobacco Use  Smoking Status Never Smoker  Smokeless Tobacco Never Used    Not on File Objective:   Vitals:   08/11/18 1429  Resp: 16  Temp: (!) 96.6 F (35.9 C)   There is no height or weight on file to calculate BMI. Constitutional Well developed. Well nourished.  Vascular Dorsalis pedis pulses present 1+ bilaterally  Posterior tibial pulses present 1+ bilaterally  Pedal hair growth diminished. Capillary refill normal to all digits.  No cyanosis or clubbing noted.  Neurologic Normal speech. Oriented to person, place, and time. Epicritic sensation to light touch grossly present  bilaterally. Protective sensation with 5.07 monofilament  present bilaterally. Vibratory sensation present bilaterally.  Dermatologic Nails elongated, thickened, dystrophic. No open wounds. No skin lesions.  Orthopedic: Normal joint ROM without pain or crepitus bilaterally. No visible deformities. No bony tenderness.   Assessment:   1. Onychomycosis of multiple toenails with type 2 diabetes mellitus and peripheral neuropathy ([place])   2. Pain due to onychomycosis of nail    Plan:  Patient was evaluated and treated and all questions answered.  Diabetes with DPN, Onychomycosis -Educated on diabetic footcare. Diabetic risk level 1 -Nails x10 debrided sharply and manually with large nail nipper and rotary burr.  -Rx fluconazole weekly due to fungal nails.   Procedure: Nail Debridement Rationale: Patient meets criteria for routine foot care due to DPN Type of Debridement: manual, sharp debridement. Instrumentation: Nail nipper, rotary burr. Number of Nails: 10  No follow-ups on file.

## 2018-09-16 DIAGNOSIS — R233 Spontaneous ecchymoses: Secondary | ICD-10-CM | POA: Diagnosis not present

## 2018-09-16 DIAGNOSIS — L570 Actinic keratosis: Secondary | ICD-10-CM | POA: Diagnosis not present

## 2018-09-16 DIAGNOSIS — L820 Inflamed seborrheic keratosis: Secondary | ICD-10-CM | POA: Diagnosis not present

## 2018-09-22 DIAGNOSIS — E1165 Type 2 diabetes mellitus with hyperglycemia: Secondary | ICD-10-CM | POA: Diagnosis not present

## 2018-10-02 DIAGNOSIS — E785 Hyperlipidemia, unspecified: Secondary | ICD-10-CM | POA: Diagnosis not present

## 2018-10-02 DIAGNOSIS — E1165 Type 2 diabetes mellitus with hyperglycemia: Secondary | ICD-10-CM | POA: Diagnosis not present

## 2018-10-20 DIAGNOSIS — J309 Allergic rhinitis, unspecified: Secondary | ICD-10-CM | POA: Diagnosis not present

## 2018-11-17 ENCOUNTER — Ambulatory Visit: Payer: Medicare Other | Admitting: Podiatry

## 2018-11-27 DIAGNOSIS — H6120 Impacted cerumen, unspecified ear: Secondary | ICD-10-CM | POA: Diagnosis not present

## 2018-11-27 DIAGNOSIS — R140 Abdominal distension (gaseous): Secondary | ICD-10-CM | POA: Diagnosis not present

## 2018-11-27 DIAGNOSIS — M2550 Pain in unspecified joint: Secondary | ICD-10-CM | POA: Diagnosis not present

## 2018-11-27 DIAGNOSIS — Z9181 History of falling: Secondary | ICD-10-CM | POA: Diagnosis not present

## 2018-12-01 DIAGNOSIS — I10 Essential (primary) hypertension: Secondary | ICD-10-CM | POA: Diagnosis not present

## 2018-12-01 DIAGNOSIS — Z794 Long term (current) use of insulin: Secondary | ICD-10-CM | POA: Diagnosis not present

## 2018-12-01 DIAGNOSIS — M4805 Spinal stenosis, thoracolumbar region: Secondary | ICD-10-CM | POA: Diagnosis not present

## 2018-12-01 DIAGNOSIS — Z79899 Other long term (current) drug therapy: Secondary | ICD-10-CM | POA: Diagnosis not present

## 2018-12-01 DIAGNOSIS — M25561 Pain in right knee: Secondary | ICD-10-CM | POA: Diagnosis not present

## 2018-12-01 DIAGNOSIS — M4804 Spinal stenosis, thoracic region: Secondary | ICD-10-CM | POA: Diagnosis not present

## 2018-12-01 DIAGNOSIS — M4802 Spinal stenosis, cervical region: Secondary | ICD-10-CM | POA: Diagnosis not present

## 2018-12-01 DIAGNOSIS — K219 Gastro-esophageal reflux disease without esophagitis: Secondary | ICD-10-CM | POA: Diagnosis not present

## 2018-12-01 DIAGNOSIS — E1165 Type 2 diabetes mellitus with hyperglycemia: Secondary | ICD-10-CM | POA: Diagnosis not present

## 2018-12-01 DIAGNOSIS — M25562 Pain in left knee: Secondary | ICD-10-CM | POA: Diagnosis not present

## 2018-12-01 DIAGNOSIS — M48061 Spinal stenosis, lumbar region without neurogenic claudication: Secondary | ICD-10-CM | POA: Diagnosis not present

## 2018-12-01 DIAGNOSIS — H6120 Impacted cerumen, unspecified ear: Secondary | ICD-10-CM | POA: Diagnosis not present

## 2018-12-01 DIAGNOSIS — E7800 Pure hypercholesterolemia, unspecified: Secondary | ICD-10-CM | POA: Diagnosis not present

## 2018-12-01 DIAGNOSIS — M1990 Unspecified osteoarthritis, unspecified site: Secondary | ICD-10-CM | POA: Diagnosis not present

## 2018-12-01 DIAGNOSIS — R140 Abdominal distension (gaseous): Secondary | ICD-10-CM | POA: Diagnosis not present

## 2018-12-03 DIAGNOSIS — E7800 Pure hypercholesterolemia, unspecified: Secondary | ICD-10-CM | POA: Diagnosis not present

## 2018-12-03 DIAGNOSIS — M25561 Pain in right knee: Secondary | ICD-10-CM | POA: Diagnosis not present

## 2018-12-03 DIAGNOSIS — M48061 Spinal stenosis, lumbar region without neurogenic claudication: Secondary | ICD-10-CM | POA: Diagnosis not present

## 2018-12-03 DIAGNOSIS — E1165 Type 2 diabetes mellitus with hyperglycemia: Secondary | ICD-10-CM | POA: Diagnosis not present

## 2018-12-03 DIAGNOSIS — M1990 Unspecified osteoarthritis, unspecified site: Secondary | ICD-10-CM | POA: Diagnosis not present

## 2018-12-03 DIAGNOSIS — M25562 Pain in left knee: Secondary | ICD-10-CM | POA: Diagnosis not present

## 2018-12-03 DIAGNOSIS — M4805 Spinal stenosis, thoracolumbar region: Secondary | ICD-10-CM | POA: Diagnosis not present

## 2018-12-03 DIAGNOSIS — K219 Gastro-esophageal reflux disease without esophagitis: Secondary | ICD-10-CM | POA: Diagnosis not present

## 2018-12-03 DIAGNOSIS — M4802 Spinal stenosis, cervical region: Secondary | ICD-10-CM | POA: Diagnosis not present

## 2018-12-03 DIAGNOSIS — M4804 Spinal stenosis, thoracic region: Secondary | ICD-10-CM | POA: Diagnosis not present

## 2018-12-03 DIAGNOSIS — R140 Abdominal distension (gaseous): Secondary | ICD-10-CM | POA: Diagnosis not present

## 2018-12-03 DIAGNOSIS — I10 Essential (primary) hypertension: Secondary | ICD-10-CM | POA: Diagnosis not present

## 2018-12-03 DIAGNOSIS — Z79899 Other long term (current) drug therapy: Secondary | ICD-10-CM | POA: Diagnosis not present

## 2018-12-03 DIAGNOSIS — H6120 Impacted cerumen, unspecified ear: Secondary | ICD-10-CM | POA: Diagnosis not present

## 2018-12-03 DIAGNOSIS — Z794 Long term (current) use of insulin: Secondary | ICD-10-CM | POA: Diagnosis not present

## 2018-12-04 DIAGNOSIS — M4805 Spinal stenosis, thoracolumbar region: Secondary | ICD-10-CM | POA: Diagnosis not present

## 2018-12-11 DIAGNOSIS — J329 Chronic sinusitis, unspecified: Secondary | ICD-10-CM | POA: Diagnosis not present

## 2018-12-11 DIAGNOSIS — H6121 Impacted cerumen, right ear: Secondary | ICD-10-CM | POA: Diagnosis not present

## 2018-12-11 DIAGNOSIS — K219 Gastro-esophageal reflux disease without esophagitis: Secondary | ICD-10-CM | POA: Diagnosis not present

## 2018-12-23 DIAGNOSIS — N6320 Unspecified lump in the left breast, unspecified quadrant: Secondary | ICD-10-CM | POA: Diagnosis not present

## 2018-12-25 ENCOUNTER — Other Ambulatory Visit: Payer: Self-pay

## 2018-12-28 ENCOUNTER — Ambulatory Visit (INDEPENDENT_AMBULATORY_CARE_PROVIDER_SITE_OTHER): Payer: Medicare Other | Admitting: Podiatry

## 2018-12-28 DIAGNOSIS — Z5329 Procedure and treatment not carried out because of patient's decision for other reasons: Secondary | ICD-10-CM

## 2018-12-28 NOTE — Progress Notes (Signed)
No show for appt. 

## 2018-12-29 DIAGNOSIS — N6320 Unspecified lump in the left breast, unspecified quadrant: Secondary | ICD-10-CM | POA: Diagnosis not present

## 2018-12-29 DIAGNOSIS — K6289 Other specified diseases of anus and rectum: Secondary | ICD-10-CM | POA: Diagnosis not present

## 2018-12-29 DIAGNOSIS — R519 Headache, unspecified: Secondary | ICD-10-CM | POA: Diagnosis not present

## 2018-12-30 DIAGNOSIS — H6091 Unspecified otitis externa, right ear: Secondary | ICD-10-CM | POA: Diagnosis not present

## 2018-12-30 DIAGNOSIS — H9201 Otalgia, right ear: Secondary | ICD-10-CM | POA: Diagnosis not present

## 2018-12-30 DIAGNOSIS — H6121 Impacted cerumen, right ear: Secondary | ICD-10-CM | POA: Diagnosis not present

## 2018-12-30 DIAGNOSIS — H61303 Acquired stenosis of external ear canal, unspecified, bilateral: Secondary | ICD-10-CM | POA: Diagnosis not present

## 2019-01-01 DIAGNOSIS — I10 Essential (primary) hypertension: Secondary | ICD-10-CM | POA: Diagnosis not present

## 2019-01-01 DIAGNOSIS — E1165 Type 2 diabetes mellitus with hyperglycemia: Secondary | ICD-10-CM | POA: Diagnosis not present

## 2019-01-01 DIAGNOSIS — M1990 Unspecified osteoarthritis, unspecified site: Secondary | ICD-10-CM | POA: Diagnosis not present

## 2019-01-02 DIAGNOSIS — E7800 Pure hypercholesterolemia, unspecified: Secondary | ICD-10-CM | POA: Diagnosis not present

## 2019-01-02 DIAGNOSIS — E1165 Type 2 diabetes mellitus with hyperglycemia: Secondary | ICD-10-CM | POA: Diagnosis not present

## 2019-01-04 DIAGNOSIS — N644 Mastodynia: Secondary | ICD-10-CM | POA: Diagnosis not present

## 2019-01-04 DIAGNOSIS — R920 Mammographic microcalcification found on diagnostic imaging of breast: Secondary | ICD-10-CM | POA: Diagnosis not present

## 2019-01-04 DIAGNOSIS — N6489 Other specified disorders of breast: Secondary | ICD-10-CM | POA: Diagnosis not present

## 2019-01-07 DIAGNOSIS — L570 Actinic keratosis: Secondary | ICD-10-CM | POA: Diagnosis not present

## 2019-01-07 DIAGNOSIS — L821 Other seborrheic keratosis: Secondary | ICD-10-CM | POA: Diagnosis not present

## 2019-01-08 DIAGNOSIS — H61303 Acquired stenosis of external ear canal, unspecified, bilateral: Secondary | ICD-10-CM | POA: Diagnosis not present

## 2019-01-08 DIAGNOSIS — H6091 Unspecified otitis externa, right ear: Secondary | ICD-10-CM | POA: Diagnosis not present

## 2019-01-12 DIAGNOSIS — K6289 Other specified diseases of anus and rectum: Secondary | ICD-10-CM | POA: Diagnosis not present

## 2019-01-12 DIAGNOSIS — C439 Malignant melanoma of skin, unspecified: Secondary | ICD-10-CM | POA: Diagnosis not present

## 2019-01-26 DIAGNOSIS — D519 Vitamin B12 deficiency anemia, unspecified: Secondary | ICD-10-CM | POA: Diagnosis not present

## 2019-01-26 DIAGNOSIS — Z79899 Other long term (current) drug therapy: Secondary | ICD-10-CM | POA: Diagnosis not present

## 2019-01-26 DIAGNOSIS — E1165 Type 2 diabetes mellitus with hyperglycemia: Secondary | ICD-10-CM | POA: Diagnosis not present

## 2019-02-01 DIAGNOSIS — I10 Essential (primary) hypertension: Secondary | ICD-10-CM | POA: Diagnosis not present

## 2019-02-01 DIAGNOSIS — E785 Hyperlipidemia, unspecified: Secondary | ICD-10-CM | POA: Diagnosis not present

## 2019-02-01 DIAGNOSIS — E1165 Type 2 diabetes mellitus with hyperglycemia: Secondary | ICD-10-CM | POA: Diagnosis not present

## 2019-03-29 DIAGNOSIS — I10 Essential (primary) hypertension: Secondary | ICD-10-CM | POA: Diagnosis not present

## 2019-03-29 DIAGNOSIS — E119 Type 2 diabetes mellitus without complications: Secondary | ICD-10-CM | POA: Diagnosis not present

## 2019-03-29 DIAGNOSIS — E785 Hyperlipidemia, unspecified: Secondary | ICD-10-CM | POA: Diagnosis not present

## 2019-03-29 DIAGNOSIS — K648 Other hemorrhoids: Secondary | ICD-10-CM | POA: Diagnosis not present

## 2019-03-29 DIAGNOSIS — K2100 Gastro-esophageal reflux disease with esophagitis, without bleeding: Secondary | ICD-10-CM | POA: Diagnosis not present

## 2019-04-05 DIAGNOSIS — E1165 Type 2 diabetes mellitus with hyperglycemia: Secondary | ICD-10-CM | POA: Diagnosis not present

## 2019-04-05 DIAGNOSIS — Z794 Long term (current) use of insulin: Secondary | ICD-10-CM | POA: Diagnosis not present

## 2019-04-07 DIAGNOSIS — E119 Type 2 diabetes mellitus without complications: Secondary | ICD-10-CM | POA: Diagnosis not present

## 2019-04-07 DIAGNOSIS — E785 Hyperlipidemia, unspecified: Secondary | ICD-10-CM | POA: Diagnosis not present

## 2019-04-19 DIAGNOSIS — B351 Tinea unguium: Secondary | ICD-10-CM | POA: Diagnosis not present

## 2019-04-19 DIAGNOSIS — M79674 Pain in right toe(s): Secondary | ICD-10-CM | POA: Diagnosis not present

## 2019-04-19 DIAGNOSIS — M79675 Pain in left toe(s): Secondary | ICD-10-CM | POA: Diagnosis not present

## 2019-04-19 DIAGNOSIS — E1142 Type 2 diabetes mellitus with diabetic polyneuropathy: Secondary | ICD-10-CM | POA: Diagnosis not present

## 2019-04-22 DIAGNOSIS — L400 Psoriasis vulgaris: Secondary | ICD-10-CM | POA: Diagnosis not present

## 2019-04-22 DIAGNOSIS — B078 Other viral warts: Secondary | ICD-10-CM | POA: Diagnosis not present

## 2019-04-22 DIAGNOSIS — L538 Other specified erythematous conditions: Secondary | ICD-10-CM | POA: Diagnosis not present

## 2019-04-22 DIAGNOSIS — L298 Other pruritus: Secondary | ICD-10-CM | POA: Diagnosis not present

## 2019-04-22 DIAGNOSIS — R208 Other disturbances of skin sensation: Secondary | ICD-10-CM | POA: Diagnosis not present

## 2019-04-22 DIAGNOSIS — L570 Actinic keratosis: Secondary | ICD-10-CM | POA: Diagnosis not present

## 2019-04-28 DIAGNOSIS — E119 Type 2 diabetes mellitus without complications: Secondary | ICD-10-CM | POA: Diagnosis not present

## 2019-04-28 DIAGNOSIS — K648 Other hemorrhoids: Secondary | ICD-10-CM | POA: Diagnosis not present

## 2019-04-28 DIAGNOSIS — I10 Essential (primary) hypertension: Secondary | ICD-10-CM | POA: Diagnosis not present

## 2019-04-28 DIAGNOSIS — E785 Hyperlipidemia, unspecified: Secondary | ICD-10-CM | POA: Diagnosis not present

## 2019-04-28 DIAGNOSIS — R008 Other abnormalities of heart beat: Secondary | ICD-10-CM | POA: Diagnosis not present

## 2019-05-11 DIAGNOSIS — K5900 Constipation, unspecified: Secondary | ICD-10-CM | POA: Diagnosis not present

## 2019-05-11 DIAGNOSIS — R140 Abdominal distension (gaseous): Secondary | ICD-10-CM | POA: Diagnosis not present

## 2019-05-11 DIAGNOSIS — R1032 Left lower quadrant pain: Secondary | ICD-10-CM | POA: Diagnosis not present

## 2019-06-02 DIAGNOSIS — J069 Acute upper respiratory infection, unspecified: Secondary | ICD-10-CM | POA: Diagnosis not present

## 2019-06-02 DIAGNOSIS — J019 Acute sinusitis, unspecified: Secondary | ICD-10-CM | POA: Diagnosis not present

## 2019-06-22 DIAGNOSIS — N200 Calculus of kidney: Secondary | ICD-10-CM | POA: Diagnosis not present

## 2019-06-22 DIAGNOSIS — R1011 Right upper quadrant pain: Secondary | ICD-10-CM | POA: Diagnosis not present

## 2019-06-22 DIAGNOSIS — K6289 Other specified diseases of anus and rectum: Secondary | ICD-10-CM | POA: Diagnosis not present

## 2019-06-22 DIAGNOSIS — K5730 Diverticulosis of large intestine without perforation or abscess without bleeding: Secondary | ICD-10-CM | POA: Diagnosis not present

## 2019-07-13 DIAGNOSIS — H6120 Impacted cerumen, unspecified ear: Secondary | ICD-10-CM | POA: Diagnosis not present

## 2019-07-13 DIAGNOSIS — E538 Deficiency of other specified B group vitamins: Secondary | ICD-10-CM | POA: Diagnosis not present

## 2019-07-13 DIAGNOSIS — N200 Calculus of kidney: Secondary | ICD-10-CM | POA: Diagnosis not present

## 2019-07-13 DIAGNOSIS — K589 Irritable bowel syndrome without diarrhea: Secondary | ICD-10-CM | POA: Diagnosis not present

## 2019-07-19 DIAGNOSIS — R339 Retention of urine, unspecified: Secondary | ICD-10-CM | POA: Diagnosis not present

## 2019-07-19 DIAGNOSIS — Z79899 Other long term (current) drug therapy: Secondary | ICD-10-CM | POA: Diagnosis not present

## 2019-07-19 DIAGNOSIS — K5909 Other constipation: Secondary | ICD-10-CM | POA: Diagnosis not present

## 2019-07-19 DIAGNOSIS — N200 Calculus of kidney: Secondary | ICD-10-CM | POA: Diagnosis not present

## 2019-07-21 DIAGNOSIS — R109 Unspecified abdominal pain: Secondary | ICD-10-CM | POA: Diagnosis not present

## 2019-07-21 DIAGNOSIS — N200 Calculus of kidney: Secondary | ICD-10-CM | POA: Diagnosis not present

## 2019-07-27 DIAGNOSIS — R339 Retention of urine, unspecified: Secondary | ICD-10-CM | POA: Diagnosis not present

## 2019-07-27 DIAGNOSIS — N200 Calculus of kidney: Secondary | ICD-10-CM | POA: Diagnosis not present

## 2019-07-27 DIAGNOSIS — M5441 Lumbago with sciatica, right side: Secondary | ICD-10-CM | POA: Diagnosis not present

## 2019-07-27 DIAGNOSIS — N816 Rectocele: Secondary | ICD-10-CM | POA: Diagnosis not present

## 2019-08-03 DIAGNOSIS — Z0000 Encounter for general adult medical examination without abnormal findings: Secondary | ICD-10-CM | POA: Diagnosis not present

## 2019-08-03 DIAGNOSIS — E1165 Type 2 diabetes mellitus with hyperglycemia: Secondary | ICD-10-CM | POA: Diagnosis not present

## 2019-08-03 DIAGNOSIS — H269 Unspecified cataract: Secondary | ICD-10-CM | POA: Diagnosis not present

## 2019-08-11 DIAGNOSIS — R928 Other abnormal and inconclusive findings on diagnostic imaging of breast: Secondary | ICD-10-CM | POA: Diagnosis not present

## 2019-08-11 DIAGNOSIS — N6489 Other specified disorders of breast: Secondary | ICD-10-CM | POA: Diagnosis not present

## 2019-08-17 ENCOUNTER — Other Ambulatory Visit: Payer: Self-pay | Admitting: Family Medicine

## 2019-08-17 DIAGNOSIS — R5381 Other malaise: Secondary | ICD-10-CM

## 2019-09-07 DIAGNOSIS — Z01818 Encounter for other preprocedural examination: Secondary | ICD-10-CM | POA: Diagnosis not present

## 2019-09-07 DIAGNOSIS — H25812 Combined forms of age-related cataract, left eye: Secondary | ICD-10-CM | POA: Diagnosis not present

## 2019-09-07 DIAGNOSIS — E119 Type 2 diabetes mellitus without complications: Secondary | ICD-10-CM | POA: Diagnosis not present

## 2019-09-08 DIAGNOSIS — R928 Other abnormal and inconclusive findings on diagnostic imaging of breast: Secondary | ICD-10-CM | POA: Diagnosis not present

## 2019-09-09 ENCOUNTER — Other Ambulatory Visit: Payer: Self-pay | Admitting: Specialist

## 2019-09-09 ENCOUNTER — Other Ambulatory Visit: Payer: Self-pay | Admitting: Surgery

## 2019-09-09 DIAGNOSIS — N6489 Other specified disorders of breast: Secondary | ICD-10-CM

## 2019-09-16 ENCOUNTER — Other Ambulatory Visit: Payer: Self-pay

## 2019-09-16 ENCOUNTER — Ambulatory Visit
Admission: RE | Admit: 2019-09-16 | Discharge: 2019-09-16 | Disposition: A | Discharge status: Home / Self Care | Payer: Medicare Other | Source: Ambulatory Visit | Attending: Surgery | Admitting: Surgery

## 2019-09-16 ENCOUNTER — Other Ambulatory Visit: Payer: Self-pay | Admitting: Surgery

## 2019-09-16 DIAGNOSIS — N6489 Other specified disorders of breast: Secondary | ICD-10-CM

## 2019-09-16 DIAGNOSIS — R928 Other abnormal and inconclusive findings on diagnostic imaging of breast: Secondary | ICD-10-CM | POA: Diagnosis not present

## 2019-09-16 DIAGNOSIS — N6011 Diffuse cystic mastopathy of right breast: Secondary | ICD-10-CM | POA: Diagnosis not present

## 2019-09-20 ENCOUNTER — Ambulatory Visit: Payer: Medicare Other | Admitting: Podiatry

## 2019-09-20 ENCOUNTER — Other Ambulatory Visit: Payer: Self-pay

## 2019-09-20 DIAGNOSIS — M792 Neuralgia and neuritis, unspecified: Secondary | ICD-10-CM | POA: Diagnosis not present

## 2019-09-20 DIAGNOSIS — E1142 Type 2 diabetes mellitus with diabetic polyneuropathy: Secondary | ICD-10-CM | POA: Diagnosis not present

## 2019-09-20 DIAGNOSIS — E1169 Type 2 diabetes mellitus with other specified complication: Secondary | ICD-10-CM

## 2019-09-20 DIAGNOSIS — B351 Tinea unguium: Secondary | ICD-10-CM

## 2019-09-20 NOTE — Progress Notes (Signed)
  Subjective:  Patient ID: Terri Martin, female    DOB: 26-Feb-1936,  MRN: [id number]  Chief Complaint  Patient presents with  . debride    DFc  . Diabetes    FBS: 113 a1C; 8  PCP: [facility] x few mo  . Numbness    pt c/o numbness and burning at rt foot -pt states it's the whole foot x few mo ago tx; none     84 y.o. female presents with the above complaint. History confirmed with patient. Reports numbness to both feet but worsening nubmness and burning pain to the right foot.  Objective:  Physical Exam: warm, good capillary refill, nail exam onychomycosis of the toenails, no trophic changes or ulcerative lesions. DP pulses palpable, PT pulses palpable and protective sensation absent Left Foot: normal exam, no swelling, tenderness, instability; ligaments intact, full range of motion of all ankle/foot joints  Right Foot: POP Right 2nd interspace with Mulder's click  No images are attached to the encounter.  Assessment:   1. Onychomycosis of multiple toenails with type 2 diabetes mellitus and peripheral neuropathy ([place])   2. Interdigital neuralgia of right foot    Plan:  Patient was evaluated and treated and all questions answered.  Onychomycosis, Diabetes and DPN -Patient is diabetic with a qualifying condition for at risk foot care.  Procedure: Nail Debridement Rationale: Patient meets criteria for routine foot care due to DPN Type of Debridement: manual, sharp debridement. Instrumentation: Nail nipper, rotary burr. Number of Nails: 10  Interdigital Neuroma  -Discussed likely local issue with concomitant neuropathy/radiculopathy -Educated on etiology -Educated on padding and proper shoegear -Injection delivered to the affected interspaces  Procedure: Neuroma Injection Location: Right 2nd interspace Skin Prep: Alcohol. Injectate: 0.5 cc 0.5% marcaine plain, 0.5 cc dexamethasone phosphate. Disposition: Patient tolerated procedure well. Injection site dressed with a band-aid.     No follow-ups on file.

## 2019-09-21 ENCOUNTER — Telehealth: Payer: Self-pay

## 2019-09-21 NOTE — Telephone Encounter (Signed)
If they are going to work they should work within the first couple days. Rest, ice the area. Call back if pain is significantly worse otherwise we will follow up in at her next visit

## 2019-09-21 NOTE — Telephone Encounter (Signed)
Pt called and LVM at the nurse line stating she had a steroid injection yesterday and her 4 toes on her Rt foot are still numb, Pt asked if there is any time frame that the injection starts to work or any recommendations you suggest for her to follow

## 2019-09-22 NOTE — Telephone Encounter (Signed)
Returned pt's phone call and LVM stating to return our call to review Dr. Eleanora Neighbor recommendations/instructions

## 2019-09-29 DIAGNOSIS — D485 Neoplasm of uncertain behavior of skin: Secondary | ICD-10-CM | POA: Diagnosis not present

## 2019-09-29 DIAGNOSIS — L400 Psoriasis vulgaris: Secondary | ICD-10-CM | POA: Diagnosis not present

## 2019-09-29 DIAGNOSIS — L309 Dermatitis, unspecified: Secondary | ICD-10-CM | POA: Diagnosis not present

## 2019-10-04 DIAGNOSIS — H6121 Impacted cerumen, right ear: Secondary | ICD-10-CM | POA: Diagnosis not present

## 2019-10-04 DIAGNOSIS — H61303 Acquired stenosis of external ear canal, unspecified, bilateral: Secondary | ICD-10-CM | POA: Diagnosis not present

## 2019-10-04 DIAGNOSIS — H6091 Unspecified otitis externa, right ear: Secondary | ICD-10-CM | POA: Diagnosis not present

## 2019-10-06 DIAGNOSIS — J329 Chronic sinusitis, unspecified: Secondary | ICD-10-CM | POA: Diagnosis not present

## 2019-10-14 ENCOUNTER — Ambulatory Visit: Payer: Medicare Other | Admitting: Podiatry

## 2019-10-18 DIAGNOSIS — C44529 Squamous cell carcinoma of skin of other part of trunk: Secondary | ICD-10-CM | POA: Diagnosis not present

## 2019-10-28 ENCOUNTER — Ambulatory Visit: Payer: Medicare Other | Admitting: Podiatry

## 2019-11-11 DIAGNOSIS — C44729 Squamous cell carcinoma of skin of left lower limb, including hip: Secondary | ICD-10-CM | POA: Diagnosis not present

## 2019-11-11 DIAGNOSIS — L400 Psoriasis vulgaris: Secondary | ICD-10-CM | POA: Diagnosis not present

## 2019-11-11 DIAGNOSIS — C44719 Basal cell carcinoma of skin of left lower limb, including hip: Secondary | ICD-10-CM | POA: Diagnosis not present

## 2019-11-11 DIAGNOSIS — L409 Psoriasis, unspecified: Secondary | ICD-10-CM | POA: Diagnosis not present

## 2019-11-18 DIAGNOSIS — Z79899 Other long term (current) drug therapy: Secondary | ICD-10-CM | POA: Diagnosis not present

## 2019-11-18 DIAGNOSIS — K219 Gastro-esophageal reflux disease without esophagitis: Secondary | ICD-10-CM | POA: Diagnosis not present

## 2019-11-18 DIAGNOSIS — E7800 Pure hypercholesterolemia, unspecified: Secondary | ICD-10-CM | POA: Diagnosis not present

## 2019-11-18 DIAGNOSIS — K5909 Other constipation: Secondary | ICD-10-CM | POA: Diagnosis not present

## 2019-11-24 DIAGNOSIS — E113393 Type 2 diabetes mellitus with moderate nonproliferative diabetic retinopathy without macular edema, bilateral: Secondary | ICD-10-CM | POA: Diagnosis not present

## 2019-12-13 DIAGNOSIS — I499 Cardiac arrhythmia, unspecified: Secondary | ICD-10-CM | POA: Diagnosis not present

## 2019-12-13 DIAGNOSIS — E785 Hyperlipidemia, unspecified: Secondary | ICD-10-CM | POA: Diagnosis not present

## 2019-12-13 DIAGNOSIS — I491 Atrial premature depolarization: Secondary | ICD-10-CM | POA: Diagnosis not present

## 2019-12-13 DIAGNOSIS — Z8673 Personal history of transient ischemic attack (TIA), and cerebral infarction without residual deficits: Secondary | ICD-10-CM | POA: Diagnosis not present

## 2019-12-13 DIAGNOSIS — I10 Essential (primary) hypertension: Secondary | ICD-10-CM | POA: Diagnosis not present

## 2019-12-14 DIAGNOSIS — C44719 Basal cell carcinoma of skin of left lower limb, including hip: Secondary | ICD-10-CM | POA: Diagnosis not present

## 2019-12-14 DIAGNOSIS — I499 Cardiac arrhythmia, unspecified: Secondary | ICD-10-CM | POA: Diagnosis not present

## 2019-12-16 DIAGNOSIS — J302 Other seasonal allergic rhinitis: Secondary | ICD-10-CM | POA: Diagnosis not present

## 2019-12-16 DIAGNOSIS — K5909 Other constipation: Secondary | ICD-10-CM | POA: Diagnosis not present

## 2019-12-16 DIAGNOSIS — D179 Benign lipomatous neoplasm, unspecified: Secondary | ICD-10-CM | POA: Diagnosis not present

## 2019-12-28 ENCOUNTER — Encounter (HOSPITAL_COMMUNITY): Payer: Self-pay

## 2019-12-28 ENCOUNTER — Emergency Department (HOSPITAL_COMMUNITY)
Admission: EM | Admit: 2019-12-28 | Discharge: 2019-12-28 | Disposition: A | Discharge status: Home / Self Care | Payer: Medicare Other | Attending: Emergency Medicine | Admitting: Emergency Medicine

## 2019-12-28 DIAGNOSIS — M79671 Pain in right foot: Secondary | ICD-10-CM | POA: Insufficient documentation

## 2019-12-28 DIAGNOSIS — I10 Essential (primary) hypertension: Secondary | ICD-10-CM | POA: Insufficient documentation

## 2019-12-28 DIAGNOSIS — M79672 Pain in left foot: Secondary | ICD-10-CM | POA: Insufficient documentation

## 2019-12-28 DIAGNOSIS — E119 Type 2 diabetes mellitus without complications: Secondary | ICD-10-CM | POA: Diagnosis not present

## 2019-12-28 DIAGNOSIS — Z85828 Personal history of other malignant neoplasm of skin: Secondary | ICD-10-CM | POA: Insufficient documentation

## 2019-12-28 DIAGNOSIS — Z79899 Other long term (current) drug therapy: Secondary | ICD-10-CM | POA: Diagnosis not present

## 2019-12-28 DIAGNOSIS — Z794 Long term (current) use of insulin: Secondary | ICD-10-CM | POA: Diagnosis not present

## 2019-12-28 DIAGNOSIS — G629 Polyneuropathy, unspecified: Secondary | ICD-10-CM | POA: Diagnosis not present

## 2019-12-28 HISTORY — DX: Essential (primary) hypertension: I10

## 2019-12-28 HISTORY — DX: Type 2 diabetes mellitus without complications: E11.9

## 2019-12-28 LAB — COMPREHENSIVE METABOLIC PANEL
ALT: 18 U/L (ref 0–44)
AST: 18 U/L (ref 15–41)
Albumin: 3.8 g/dL (ref 3.5–5.0)
Alkaline Phosphatase: 60 U/L (ref 38–126)
Anion gap: 11 (ref 5–15)
BUN: 10 mg/dL (ref 8–23)
CO2: 27 mmol/L (ref 22–32)
Calcium: 9.3 mg/dL (ref 8.9–10.3)
Chloride: 91 mmol/L — ABNORMAL LOW (ref 98–111)
Creatinine, Ser: 0.79 mg/dL (ref 0.44–1.00)
GFR, Estimated: >60 mL/min (ref >60)
Glucose, Bld: 225 mg/dL — ABNORMAL HIGH (ref 70–99)
Potassium: 4.2 mmol/L (ref 3.5–5.1)
Sodium: 129 mmol/L — ABNORMAL LOW (ref 135–145)
Total Bilirubin: 0.7 mg/dL (ref 0.3–1.2)
Total Protein: 6.4 g/dL — ABNORMAL LOW (ref 6.5–8.1)

## 2019-12-28 LAB — CBG MONITORING, ED: Glucose-Capillary: 227 mg/dL — ABNORMAL HIGH (ref 70–99)

## 2019-12-28 LAB — URINALYSIS, ROUTINE W REFLEX MICROSCOPIC
Bacteria, UA: NONE SEEN
Bilirubin Urine: NEGATIVE
Glucose, UA: >=500 mg/dL — AB
Hgb urine dipstick: NEGATIVE
Ketones, ur: 20 mg/dL — AB
Leukocytes,Ua: NEGATIVE
Nitrite: NEGATIVE
Protein, ur: NEGATIVE mg/dL
Specific Gravity, Urine: 1.009 (ref 1.005–1.030)
pH: 6 (ref 5.0–8.0)

## 2019-12-28 LAB — LIPASE, BLOOD: Lipase: 30 U/L (ref 11–51)

## 2019-12-28 LAB — CBC
HCT: 42.4 % (ref 36.0–46.0)
Hemoglobin: 13.8 g/dL (ref 12.0–15.0)
MCH: 31.1 pg (ref 26.0–34.0)
MCHC: 32.5 g/dL (ref 30.0–36.0)
MCV: 95.5 fL (ref 80.0–100.0)
Platelets: 226 10*3/uL (ref 150–400)
RBC: 4.44 MIL/uL (ref 3.87–5.11)
RDW: 12 % (ref 11.5–15.5)
WBC: 7.2 10*3/uL (ref 4.0–10.5)
nRBC: 0 % (ref 0.0–0.2)

## 2019-12-28 NOTE — ED Provider Notes (Signed)
[facility] EMERGENCY DEPARTMENT Provider Note   CSN: [id number] Arrival date & time: 12/28/19  1242     History Chief Complaint  Patient presents with  . Foot Pain    Terri Martin is a 84 y.o. female with past medical history significant for diabetes and hypertension. Denies abdominal surgical history.  HPI Patient presents to emergency department today with chief complaint of bilateral foot pain x3 days.  Patient states any time she eats a meal she will have a burning sensation in bilateral feet that radiates to her waist. The pain has been constant, waxes and wanes in severity. She does state however she was able to eat salmon prior to arrival without experiencing the foot pain. She saw her pcp for these symptoms yesterday.  She also admits to being diagnosed with a right sided kidney stone that she reports was 34mm in size, does not remember when exactly. She was told she will likely pass stone and surgical intervention was not needed at the time. She has been urinating normally. Denies gross hematuria. She has not taken any medications for her symptoms prior to arrival. Denies any fall or injury to her feet. She denies any fever, chills, chest pain, shortness of breath, back pain, saddle anesthesia, urinary frequency, dysuria,  wound.      Past Medical History:  Diagnosis Date  . Diabetes mellitus without complication ([place])   . Hypertension     Patient Active Problem List   Diagnosis Date Noted  . Abnormal mammogram of right breast 09/08/2019  . Squamous cell carcinoma of left lower leg 12/11/2016  . Postoperative examination 12/05/2016  . Neoplasm of uncertain behavior of skin of lower extremity 12/04/2016  . Dyslipidemia 10/28/2016  . Essential hypertension 10/28/2016  . History of TIA (transient ischemic attack) 10/28/2016      OB History   No obstetric history on file.     No family history on file.  Social History   Tobacco Use  .  Smoking status: Never Smoker  . Smokeless tobacco: Never Used  Substance Use Topics  . Alcohol use: Not on file  . Drug use: Not on file    Home Medications Prior to Admission medications   Medication Sig Start Date End Date Taking? Authorizing Provider  ALPRAZolam Duanne Moron) 1 MG tablet Take 1 mg by mouth at bedtime as needed for anxiety.    [provider]  cefdinir (OMNICEF) 300 MG capsule Take 300 mg by mouth 2 (two) times daily. 12/11/18   [provider]  ciprofloxacin (CILOXAN) 0.3 % ophthalmic solution 4 drops 2 (two) times daily. 10/04/19   [provider]  citalopram (CELEXA) 10 MG tablet Take 10 mg by mouth daily. 10/06/19   [provider]  clobetasol cream (TEMOVATE) 0.05 %  11/10/18   [provider]  clotrimazole-betamethasone (LOTRISONE) cream APPLY CREAM TO BUTTOCK AREA TWICE DAILY 10/16/18   [provider]  dexamethasone (DECADRON) 0.1 % ophthalmic solution 4 drops 2 (two) times daily. 10/06/19   [provider]  empagliflozin (JARDIANCE) 10 MG TABS tablet Take 1 tablet by mouth daily. 08/31/19   [provider]  famotidine (PEPCID) 40 MG tablet Take 40 mg by mouth at bedtime. 12/11/18   [provider]  fluconazole (DIFLUCAN) 150 MG tablet Take 1 tablet (150 mg total) by mouth once a week. 08/11/18   Evelina Bucy, DPM  insulin lispro (HUMALOG) 100 UNIT/ML cartridge Inject into the skin 3 (three) times daily with meals.  [provider]  lisinopril-hydrochlorothiazide (ZESTORETIC) 10-12.5 MG tablet Take 1 tablet by mouth daily.    [provider]  Multiple Vitamin (MULTI-VITAMIN) tablet Take 1 tablet by mouth daily.    [provider]  pramoxine-hydrocortisone (PROCTOCREAM-HC) 1-1 % rectal cream APPLY TO AFFECTED AREA AS NEEDED 08/04/18   [provider]  rosuvastatin (CRESTOR) 10 MG tablet Take 10 mg by mouth daily.    [provider]  TRESIBA FLEXTOUCH 200  UNIT/ML SOPN INJECT 20 UNITS SUBCUTANEOUSLY ONCE DAILY 10/30/18   [provider]  valACYclovir (VALTREX) 1000 MG tablet Take 1,000 mg by mouth daily. 12/09/18   [provider]  VIRTUSSIN A/C 100-10 MG/5ML syrup Take 5 mLs by mouth every 6 (six) hours as needed. 10/06/19   [provider]    Allergies    Patient has no allergy information on record.  Review of Systems   Review of Systems All other systems are reviewed and are negative for acute change except as noted in the HPI.  Physical Exam Updated Vital Signs BP (!) 152/52 (BP Location: Right Arm)   Pulse 62   Temp 97.7 F (36.5 C) (Oral)   Resp 16   Ht 5\' 9"  (1.753 m)   Wt 77.1 kg   SpO2 100%   BMI 25.10 kg/m   Physical Exam Vitals and nursing note reviewed.  Constitutional:      General: She is not in acute distress.    Appearance: She is not ill-appearing.  HENT:     Head: Normocephalic and atraumatic.     Right Ear: Tympanic membrane and external ear normal.     Left Ear: Tympanic membrane and external ear normal.     Nose: Nose normal.     Mouth/Throat:     Mouth: Mucous membranes are moist.     Pharynx: Oropharynx is clear.  Eyes:     General: No scleral icterus.       Right eye: No discharge.        Left eye: No discharge.     Extraocular Movements: Extraocular movements intact.     Conjunctiva/sclera: Conjunctivae normal.     Pupils: Pupils are equal, round, and reactive to light.  Neck:     Vascular: No JVD.  Cardiovascular:     Rate and Rhythm: Normal rate and regular rhythm.     Pulses: Normal pulses.          Radial pulses are 2+ on the right side and 2+ on the left side.       Dorsalis pedis pulses are 2+ on the right side and 2+ on the left side.     Heart sounds: Normal heart sounds.  Pulmonary:     Comments: Lungs clear to auscultation in all fields. Symmetric chest rise. No wheezing, rales, or rhonchi. Abdominal:     Tenderness: There is no right CVA tenderness or  left CVA tenderness.     Comments: Abdomen is soft, non-distended, and non-tender in all quadrants. No rigidity, no guarding. No peritoneal signs.  Musculoskeletal:        General: Normal range of motion.     Cervical back: Normal range of motion.     Comments: Full ROM of bilateral ankles and feet  Feet:     Right foot:     Skin integrity: Skin integrity normal.     Toenail Condition: Right toenails are abnormally thick.     Left foot:     Skin integrity: Skin integrity  normal.     Toenail Condition: Left toenails are abnormally thick.  Skin:    General: Skin is warm and dry.     Capillary Refill: Capillary refill takes less than 2 seconds.  Neurological:     Mental Status: She is oriented to person, place, and time.     GCS: GCS eye subscore is 4. GCS verbal subscore is 5. GCS motor subscore is 6.     Deep Tendon Reflexes: Reflexes are normal and symmetric.     Comments: Fluent speech, no facial droop.  Sensation grossly intact to light touch in the lower extremities bilaterally. No saddle anesthesias. Strength 5/5 with flexion and extension at the bilateral hips, knees, and ankles. No noted gait deficit. Coordination intact with heel to shin testing.  Psychiatric:        Behavior: Behavior normal.     ED Results / Procedures / Treatments   Labs (all labs ordered are listed, but only abnormal results are displayed) Labs Reviewed  COMPREHENSIVE METABOLIC PANEL - Abnormal; Notable for the following components:      Result Value   Sodium 129 (*)    Chloride 91 (*)    Glucose, Bld 225 (*)    Total Protein 6.4 (*)    All other components within normal limits  URINALYSIS, ROUTINE W REFLEX MICROSCOPIC - Abnormal; Notable for the following components:   Glucose, UA >=500 (*)    Ketones, ur 20 (*)    All other components within normal limits  CBG MONITORING, ED - Abnormal; Notable for the following components:   Glucose-Capillary 227 (*)    All other components within normal  limits  LIPASE, BLOOD  CBC    EKG EKG Interpretation  Date/Time:  Tuesday December 28 2019 12:48:50 EDT Ventricular Rate:  66 PR Interval:  170 QRS Duration: 74 QT Interval:  420 QTC Calculation: 440 R Axis:   11 Text Interpretation: Normal sinus rhythm Cannot rule out Anterior infarct , age undetermined Abnormal ECG No old tracing to compare Confirmed by Noemi Chapel [phone]) on 12/28/2019 1:56:54 PM   Radiology No results found.  Procedures Procedures (including critical care time)  Medications Ordered in ED Medications - No data to display  ED Course  I have reviewed the triage vital signs and the nursing notes.  Pertinent labs & imaging results that were available during my care of the patient were reviewed by me and considered in my medical decision making (see chart for details).    MDM Rules/Calculators/A&P                          History provided by patient with additional history obtained from chart review.     Patient seen and examined. Patient presents awake, alert, hemodynamically stable, afebrile, non toxic. She is very well appearing. She has no abdominal tenderness on exam, no peritoneal signs. No CVA tenderness. linically she does not appear dehydrated. She is tolerating PO intake while here in the department. Neuro exam is normal. Foot exam is normal. No findings on exam to suggest foot infection, cellulitis, osteomyelitis.  She is ambulatory with normal gait.  Labs and EKG were collected in triage. EKG without stemi. I viewed results which show no leukocytosis, no anemia. She does have mild hyponatremia 129, no prior to compare. Glucose elevated at 225, no significant electrolyte derangement, no renal insufficiency, normal anion gap Lipase within normal range. UA without infection, does have ketones. Discussed possibility of IVF  for hydration, patient feels she can hydrate with PO liquids.  She is tolerating PO intake while here in the emergency department.  Serial abdominal exams are benign. Symptoms are suggestive of diabetic neuropathy. Recommend patient follow up with GI for outpatient workup if needed. Advised patient to have sodium rechecked by pcp.  The patient appears reasonably screened and/or stabilized for discharge and I doubt any other medical condition or other [facility] requiring further screening, evaluation, or treatment in the ED at this time prior to discharge. The patient is safe for discharge with strict return precautions discussed. Recommend pcp follow up. Findings and plan of care discussed with supervising physician Dr. Sabra Heck.   Portions of this note were generated with [employer]. Dictation errors may occur despite best attempts at proofreading.    Final Clinical Impression(s) / ED Diagnoses Final diagnoses:  Pain in both feet    Rx / DC Orders ED Discharge Orders    None       Flint Melter 12/28/19 1508    Noemi Chapel, MD 12/30/19 [phone]

## 2019-12-28 NOTE — ED Triage Notes (Signed)
Pt reports for the past couple days she has had severe abd pain and pain that shoots from her feet up to her abd after she tries to eat a meal. Hx of bone spurs throughout her spine. Pt in a wheelchair today due to difficulty walking the past couple days. Right foot is numb. Pt a.o, hx of diabetes but unable to take her insulin due to not being able to eat.

## 2019-12-28 NOTE — Discharge Instructions (Signed)
You will need to follow up with the GI doctors about your abdominal pain. I have given you the information for the on-call group here in [place]. If you would rather follow up with a local GI doctor you can follow up with anyone.  You should also keep your appointment scheduled with Dr. Ronnald Ramp to further discuss your symptoms as well.   Your sodium level was 129. You should have this rechecked by your primary care doctor.  Do you best to stay well hydrated by drinking fluids and eating food you can tolerate.  Return to the emergency department for any new or worsening symptoms.

## 2020-01-04 DIAGNOSIS — M48062 Spinal stenosis, lumbar region with neurogenic claudication: Secondary | ICD-10-CM | POA: Diagnosis not present

## 2020-01-04 DIAGNOSIS — M545 Low back pain, unspecified: Secondary | ICD-10-CM | POA: Diagnosis not present

## 2020-01-04 DIAGNOSIS — M4316 Spondylolisthesis, lumbar region: Secondary | ICD-10-CM | POA: Diagnosis not present

## 2020-01-07 DIAGNOSIS — C439 Malignant melanoma of skin, unspecified: Secondary | ICD-10-CM | POA: Diagnosis not present

## 2020-01-07 DIAGNOSIS — E538 Deficiency of other specified B group vitamins: Secondary | ICD-10-CM | POA: Diagnosis not present

## 2020-01-07 DIAGNOSIS — R109 Unspecified abdominal pain: Secondary | ICD-10-CM | POA: Diagnosis not present

## 2020-01-07 DIAGNOSIS — R251 Tremor, unspecified: Secondary | ICD-10-CM | POA: Diagnosis not present

## 2020-01-12 DIAGNOSIS — R251 Tremor, unspecified: Secondary | ICD-10-CM | POA: Diagnosis not present

## 2020-01-12 DIAGNOSIS — I6782 Cerebral ischemia: Secondary | ICD-10-CM | POA: Diagnosis not present

## 2020-01-12 DIAGNOSIS — G319 Degenerative disease of nervous system, unspecified: Secondary | ICD-10-CM | POA: Diagnosis not present

## 2020-01-12 DIAGNOSIS — Z85828 Personal history of other malignant neoplasm of skin: Secondary | ICD-10-CM | POA: Diagnosis not present

## 2020-01-18 DIAGNOSIS — I517 Cardiomegaly: Secondary | ICD-10-CM | POA: Diagnosis not present

## 2020-01-18 DIAGNOSIS — I499 Cardiac arrhythmia, unspecified: Secondary | ICD-10-CM | POA: Diagnosis not present

## 2020-01-19 DIAGNOSIS — E1165 Type 2 diabetes mellitus with hyperglycemia: Secondary | ICD-10-CM | POA: Diagnosis not present

## 2020-01-20 ENCOUNTER — Ambulatory Visit: Payer: Medicare Other | Admitting: Podiatry

## 2020-01-20 ENCOUNTER — Encounter: Payer: Self-pay | Admitting: Podiatry

## 2020-01-20 ENCOUNTER — Other Ambulatory Visit: Payer: Self-pay

## 2020-01-20 DIAGNOSIS — E1169 Type 2 diabetes mellitus with other specified complication: Secondary | ICD-10-CM | POA: Diagnosis not present

## 2020-01-20 DIAGNOSIS — E1142 Type 2 diabetes mellitus with diabetic polyneuropathy: Secondary | ICD-10-CM

## 2020-01-20 DIAGNOSIS — B351 Tinea unguium: Secondary | ICD-10-CM

## 2020-02-02 NOTE — Progress Notes (Signed)
  Subjective:  Patient ID: Terri Martin, female    DOB: 1935-09-02,  MRN: [id number]  Chief Complaint  Patient presents with  . Nail Problem    trim nails     84 y.o. female presents with the above complaint. History confirmed with patient.   Objective:  Physical Exam: warm, good capillary refill, nail exam onychomycosis of the toenails, no trophic changes or ulcerative lesions. DP pulses palpable, PT pulses palpable and protective sensation absent Left Foot: normal exam, no swelling, tenderness, instability; ligaments intact, full range of motion of all ankle/foot joints  Right Foot: POP Right 2nd interspace with Mulder's click  No images are attached to the encounter.  Assessment:   1. Onychomycosis of multiple toenails with type 2 diabetes mellitus and peripheral neuropathy ([place])    Plan:  Patient was evaluated and treated and all questions answered.  Onychomycosis, Diabetes and DPN -Patient is diabetic with a qualifying condition for at risk foot care.   Procedure: Nail Debridement Type of Debridement: manual, sharp debridement. Instrumentation: Nail nipper, rotary burr. Number of Nails: 10      No follow-ups on file.

## 2020-02-10 DIAGNOSIS — K644 Residual hemorrhoidal skin tags: Secondary | ICD-10-CM | POA: Diagnosis not present

## 2020-02-14 DIAGNOSIS — Z8673 Personal history of transient ischemic attack (TIA), and cerebral infarction without residual deficits: Secondary | ICD-10-CM | POA: Diagnosis not present

## 2020-02-14 DIAGNOSIS — I10 Essential (primary) hypertension: Secondary | ICD-10-CM | POA: Diagnosis not present

## 2020-02-14 DIAGNOSIS — I499 Cardiac arrhythmia, unspecified: Secondary | ICD-10-CM | POA: Diagnosis not present

## 2020-02-15 DIAGNOSIS — I491 Atrial premature depolarization: Secondary | ICD-10-CM | POA: Diagnosis not present

## 2020-02-15 DIAGNOSIS — I471 Supraventricular tachycardia: Secondary | ICD-10-CM | POA: Diagnosis not present

## 2020-02-15 DIAGNOSIS — I493 Ventricular premature depolarization: Secondary | ICD-10-CM | POA: Diagnosis not present

## 2020-03-07 DIAGNOSIS — H2513 Age-related nuclear cataract, bilateral: Secondary | ICD-10-CM | POA: Diagnosis not present

## 2020-03-07 DIAGNOSIS — H353133 Nonexudative age-related macular degeneration, bilateral, advanced atrophic without subfoveal involvement: Secondary | ICD-10-CM | POA: Diagnosis not present

## 2020-03-13 DIAGNOSIS — N200 Calculus of kidney: Secondary | ICD-10-CM | POA: Diagnosis not present

## 2020-03-13 DIAGNOSIS — J019 Acute sinusitis, unspecified: Secondary | ICD-10-CM | POA: Diagnosis not present

## 2020-03-13 DIAGNOSIS — N390 Urinary tract infection, site not specified: Secondary | ICD-10-CM | POA: Diagnosis not present

## 2020-03-17 DIAGNOSIS — R918 Other nonspecific abnormal finding of lung field: Secondary | ICD-10-CM | POA: Diagnosis not present

## 2020-03-17 DIAGNOSIS — R5383 Other fatigue: Secondary | ICD-10-CM | POA: Diagnosis not present

## 2020-03-17 DIAGNOSIS — K449 Diaphragmatic hernia without obstruction or gangrene: Secondary | ICD-10-CM | POA: Diagnosis not present

## 2020-03-17 DIAGNOSIS — E119 Type 2 diabetes mellitus without complications: Secondary | ICD-10-CM | POA: Diagnosis not present

## 2020-03-17 DIAGNOSIS — Z20822 Contact with and (suspected) exposure to covid-19: Secondary | ICD-10-CM | POA: Diagnosis not present

## 2020-03-17 DIAGNOSIS — R0981 Nasal congestion: Secondary | ICD-10-CM | POA: Diagnosis not present

## 2020-03-17 DIAGNOSIS — R519 Headache, unspecified: Secondary | ICD-10-CM | POA: Diagnosis not present

## 2020-03-24 DIAGNOSIS — E119 Type 2 diabetes mellitus without complications: Secondary | ICD-10-CM | POA: Diagnosis not present

## 2020-03-24 DIAGNOSIS — E785 Hyperlipidemia, unspecified: Secondary | ICD-10-CM | POA: Diagnosis not present

## 2020-03-27 DIAGNOSIS — R5383 Other fatigue: Secondary | ICD-10-CM | POA: Diagnosis not present

## 2020-03-27 DIAGNOSIS — K5909 Other constipation: Secondary | ICD-10-CM | POA: Diagnosis not present

## 2020-03-27 DIAGNOSIS — I872 Venous insufficiency (chronic) (peripheral): Secondary | ICD-10-CM | POA: Diagnosis not present

## 2020-03-27 DIAGNOSIS — J019 Acute sinusitis, unspecified: Secondary | ICD-10-CM | POA: Diagnosis not present

## 2020-03-27 DIAGNOSIS — R202 Paresthesia of skin: Secondary | ICD-10-CM | POA: Diagnosis not present

## 2020-04-10 DIAGNOSIS — M5416 Radiculopathy, lumbar region: Secondary | ICD-10-CM | POA: Diagnosis not present

## 2020-04-10 DIAGNOSIS — M5136 Other intervertebral disc degeneration, lumbar region: Secondary | ICD-10-CM | POA: Diagnosis not present

## 2020-04-10 DIAGNOSIS — M4316 Spondylolisthesis, lumbar region: Secondary | ICD-10-CM | POA: Diagnosis not present

## 2020-04-10 DIAGNOSIS — M5116 Intervertebral disc disorders with radiculopathy, lumbar region: Secondary | ICD-10-CM | POA: Diagnosis not present

## 2020-04-10 DIAGNOSIS — M48062 Spinal stenosis, lumbar region with neurogenic claudication: Secondary | ICD-10-CM | POA: Diagnosis not present

## 2020-04-12 DIAGNOSIS — M5126 Other intervertebral disc displacement, lumbar region: Secondary | ICD-10-CM | POA: Diagnosis not present

## 2020-04-12 DIAGNOSIS — Z780 Asymptomatic menopausal state: Secondary | ICD-10-CM | POA: Diagnosis not present

## 2020-04-12 DIAGNOSIS — M5416 Radiculopathy, lumbar region: Secondary | ICD-10-CM | POA: Diagnosis not present

## 2020-04-12 DIAGNOSIS — M48061 Spinal stenosis, lumbar region without neurogenic claudication: Secondary | ICD-10-CM | POA: Diagnosis not present

## 2020-04-17 DIAGNOSIS — M48062 Spinal stenosis, lumbar region with neurogenic claudication: Secondary | ICD-10-CM | POA: Diagnosis not present

## 2020-04-17 DIAGNOSIS — M5451 Vertebrogenic low back pain: Secondary | ICD-10-CM | POA: Diagnosis not present

## 2020-04-17 DIAGNOSIS — G8929 Other chronic pain: Secondary | ICD-10-CM | POA: Diagnosis not present

## 2020-04-17 DIAGNOSIS — M47816 Spondylosis without myelopathy or radiculopathy, lumbar region: Secondary | ICD-10-CM | POA: Diagnosis not present

## 2020-04-20 DIAGNOSIS — M4316 Spondylolisthesis, lumbar region: Secondary | ICD-10-CM | POA: Diagnosis not present

## 2020-04-20 DIAGNOSIS — E119 Type 2 diabetes mellitus without complications: Secondary | ICD-10-CM | POA: Diagnosis not present

## 2020-04-20 DIAGNOSIS — Z859 Personal history of malignant neoplasm, unspecified: Secondary | ICD-10-CM | POA: Diagnosis not present

## 2020-04-20 DIAGNOSIS — M5417 Radiculopathy, lumbosacral region: Secondary | ICD-10-CM | POA: Diagnosis not present

## 2020-04-20 DIAGNOSIS — M9963 Osseous and subluxation stenosis of intervertebral foramina of lumbar region: Secondary | ICD-10-CM | POA: Diagnosis not present

## 2020-04-20 DIAGNOSIS — M5416 Radiculopathy, lumbar region: Secondary | ICD-10-CM | POA: Diagnosis not present

## 2020-04-20 DIAGNOSIS — M48062 Spinal stenosis, lumbar region with neurogenic claudication: Secondary | ICD-10-CM | POA: Diagnosis not present

## 2020-04-20 DIAGNOSIS — M5137 Other intervertebral disc degeneration, lumbosacral region: Secondary | ICD-10-CM | POA: Diagnosis not present

## 2020-04-20 DIAGNOSIS — M5136 Other intervertebral disc degeneration, lumbar region: Secondary | ICD-10-CM | POA: Diagnosis not present

## 2020-04-26 DIAGNOSIS — K219 Gastro-esophageal reflux disease without esophagitis: Secondary | ICD-10-CM | POA: Diagnosis not present

## 2020-04-26 DIAGNOSIS — K5901 Slow transit constipation: Secondary | ICD-10-CM | POA: Diagnosis not present

## 2020-05-01 DIAGNOSIS — R109 Unspecified abdominal pain: Secondary | ICD-10-CM | POA: Diagnosis not present

## 2020-05-10 DIAGNOSIS — R600 Localized edema: Secondary | ICD-10-CM | POA: Diagnosis not present

## 2020-05-10 DIAGNOSIS — B351 Tinea unguium: Secondary | ICD-10-CM | POA: Diagnosis not present

## 2020-05-10 DIAGNOSIS — M47816 Spondylosis without myelopathy or radiculopathy, lumbar region: Secondary | ICD-10-CM | POA: Diagnosis not present

## 2020-05-10 DIAGNOSIS — I779 Disorder of arteries and arterioles, unspecified: Secondary | ICD-10-CM | POA: Diagnosis not present

## 2020-05-10 DIAGNOSIS — E1140 Type 2 diabetes mellitus with diabetic neuropathy, unspecified: Secondary | ICD-10-CM | POA: Diagnosis not present

## 2020-05-11 DIAGNOSIS — K602 Anal fissure, unspecified: Secondary | ICD-10-CM | POA: Diagnosis not present

## 2020-05-11 DIAGNOSIS — K5900 Constipation, unspecified: Secondary | ICD-10-CM | POA: Diagnosis not present

## 2020-05-12 DIAGNOSIS — I739 Peripheral vascular disease, unspecified: Secondary | ICD-10-CM | POA: Diagnosis not present

## 2020-05-19 DIAGNOSIS — I70213 Atherosclerosis of native arteries of extremities with intermittent claudication, bilateral legs: Secondary | ICD-10-CM | POA: Diagnosis not present

## 2020-05-20 DIAGNOSIS — L03116 Cellulitis of left lower limb: Secondary | ICD-10-CM | POA: Diagnosis not present

## 2020-05-20 DIAGNOSIS — L03115 Cellulitis of right lower limb: Secondary | ICD-10-CM | POA: Diagnosis not present

## 2020-05-23 DIAGNOSIS — E1159 Type 2 diabetes mellitus with other circulatory complications: Secondary | ICD-10-CM | POA: Diagnosis not present

## 2020-05-23 DIAGNOSIS — I70213 Atherosclerosis of native arteries of extremities with intermittent claudication, bilateral legs: Secondary | ICD-10-CM | POA: Diagnosis not present

## 2020-05-25 DIAGNOSIS — Z01812 Encounter for preprocedural laboratory examination: Secondary | ICD-10-CM | POA: Diagnosis not present

## 2020-05-25 DIAGNOSIS — H25012 Cortical age-related cataract, left eye: Secondary | ICD-10-CM | POA: Diagnosis not present

## 2020-05-25 DIAGNOSIS — Z01810 Encounter for preprocedural cardiovascular examination: Secondary | ICD-10-CM | POA: Diagnosis not present

## 2020-05-25 DIAGNOSIS — Z01818 Encounter for other preprocedural examination: Secondary | ICD-10-CM | POA: Diagnosis not present

## 2020-05-25 DIAGNOSIS — H5712 Ocular pain, left eye: Secondary | ICD-10-CM | POA: Diagnosis not present

## 2020-05-29 DIAGNOSIS — I739 Peripheral vascular disease, unspecified: Secondary | ICD-10-CM | POA: Diagnosis not present

## 2020-05-30 DIAGNOSIS — E1159 Type 2 diabetes mellitus with other circulatory complications: Secondary | ICD-10-CM | POA: Diagnosis not present

## 2020-05-30 DIAGNOSIS — I70213 Atherosclerosis of native arteries of extremities with intermittent claudication, bilateral legs: Secondary | ICD-10-CM | POA: Diagnosis not present

## 2020-05-31 DIAGNOSIS — H52202 Unspecified astigmatism, left eye: Secondary | ICD-10-CM | POA: Diagnosis not present

## 2020-05-31 DIAGNOSIS — H43813 Vitreous degeneration, bilateral: Secondary | ICD-10-CM | POA: Diagnosis not present

## 2020-05-31 DIAGNOSIS — H259 Unspecified age-related cataract: Secondary | ICD-10-CM | POA: Diagnosis not present

## 2020-05-31 DIAGNOSIS — H5010 Unspecified exotropia: Secondary | ICD-10-CM | POA: Diagnosis not present

## 2020-05-31 DIAGNOSIS — H2512 Age-related nuclear cataract, left eye: Secondary | ICD-10-CM | POA: Diagnosis not present

## 2020-05-31 DIAGNOSIS — H43393 Other vitreous opacities, bilateral: Secondary | ICD-10-CM | POA: Diagnosis not present

## 2020-06-02 DIAGNOSIS — I714 Abdominal aortic aneurysm, without rupture: Secondary | ICD-10-CM | POA: Diagnosis not present

## 2020-06-02 DIAGNOSIS — R109 Unspecified abdominal pain: Secondary | ICD-10-CM | POA: Diagnosis not present

## 2020-06-06 DIAGNOSIS — K635 Polyp of colon: Secondary | ICD-10-CM | POA: Diagnosis not present

## 2020-06-06 DIAGNOSIS — K2100 Gastro-esophageal reflux disease with esophagitis, without bleeding: Secondary | ICD-10-CM | POA: Diagnosis not present

## 2020-06-06 DIAGNOSIS — K5730 Diverticulosis of large intestine without perforation or abscess without bleeding: Secondary | ICD-10-CM | POA: Diagnosis not present

## 2020-06-06 DIAGNOSIS — I10 Essential (primary) hypertension: Secondary | ICD-10-CM | POA: Diagnosis not present

## 2020-06-06 DIAGNOSIS — K5901 Slow transit constipation: Secondary | ICD-10-CM | POA: Diagnosis not present

## 2020-06-06 DIAGNOSIS — K2210 Ulcer of esophagus without bleeding: Secondary | ICD-10-CM | POA: Diagnosis not present

## 2020-06-06 DIAGNOSIS — K219 Gastro-esophageal reflux disease without esophagitis: Secondary | ICD-10-CM | POA: Diagnosis not present

## 2020-06-06 DIAGNOSIS — E7800 Pure hypercholesterolemia, unspecified: Secondary | ICD-10-CM | POA: Diagnosis not present

## 2020-06-06 DIAGNOSIS — K5900 Constipation, unspecified: Secondary | ICD-10-CM | POA: Diagnosis not present

## 2020-06-06 DIAGNOSIS — K3189 Other diseases of stomach and duodenum: Secondary | ICD-10-CM | POA: Diagnosis not present

## 2020-06-06 DIAGNOSIS — K2970 Gastritis, unspecified, without bleeding: Secondary | ICD-10-CM | POA: Diagnosis not present

## 2020-06-08 DIAGNOSIS — I70213 Atherosclerosis of native arteries of extremities with intermittent claudication, bilateral legs: Secondary | ICD-10-CM | POA: Diagnosis not present

## 2020-06-08 DIAGNOSIS — E1159 Type 2 diabetes mellitus with other circulatory complications: Secondary | ICD-10-CM | POA: Diagnosis not present

## 2020-06-22 DIAGNOSIS — K5730 Diverticulosis of large intestine without perforation or abscess without bleeding: Secondary | ICD-10-CM | POA: Diagnosis not present

## 2020-06-22 DIAGNOSIS — K769 Liver disease, unspecified: Secondary | ICD-10-CM | POA: Diagnosis not present

## 2020-06-22 DIAGNOSIS — K5710 Diverticulosis of small intestine without perforation or abscess without bleeding: Secondary | ICD-10-CM | POA: Diagnosis not present

## 2020-06-22 DIAGNOSIS — K5750 Diverticulosis of both small and large intestine without perforation or abscess without bleeding: Secondary | ICD-10-CM | POA: Diagnosis not present

## 2020-06-22 DIAGNOSIS — N200 Calculus of kidney: Secondary | ICD-10-CM | POA: Diagnosis not present

## 2020-06-22 DIAGNOSIS — K5900 Constipation, unspecified: Secondary | ICD-10-CM | POA: Diagnosis not present

## 2020-06-22 DIAGNOSIS — M4316 Spondylolisthesis, lumbar region: Secondary | ICD-10-CM | POA: Diagnosis not present

## 2020-06-29 DIAGNOSIS — K629 Disease of anus and rectum, unspecified: Secondary | ICD-10-CM | POA: Diagnosis not present

## 2020-06-29 DIAGNOSIS — G629 Polyneuropathy, unspecified: Secondary | ICD-10-CM | POA: Diagnosis not present

## 2020-06-29 DIAGNOSIS — K5900 Constipation, unspecified: Secondary | ICD-10-CM | POA: Diagnosis not present

## 2020-06-29 DIAGNOSIS — Z0000 Encounter for general adult medical examination without abnormal findings: Secondary | ICD-10-CM | POA: Diagnosis not present

## 2020-07-05 DIAGNOSIS — R519 Headache, unspecified: Secondary | ICD-10-CM | POA: Diagnosis not present

## 2020-07-05 DIAGNOSIS — I6623 Occlusion and stenosis of bilateral posterior cerebral arteries: Secondary | ICD-10-CM | POA: Diagnosis not present

## 2020-07-05 DIAGNOSIS — I6602 Occlusion and stenosis of left middle cerebral artery: Secondary | ICD-10-CM | POA: Diagnosis not present

## 2020-07-05 DIAGNOSIS — Z8673 Personal history of transient ischemic attack (TIA), and cerebral infarction without residual deficits: Secondary | ICD-10-CM | POA: Diagnosis not present

## 2020-07-05 DIAGNOSIS — M542 Cervicalgia: Secondary | ICD-10-CM | POA: Diagnosis not present

## 2020-07-05 DIAGNOSIS — E119 Type 2 diabetes mellitus without complications: Secondary | ICD-10-CM | POA: Diagnosis not present

## 2020-07-05 DIAGNOSIS — I6523 Occlusion and stenosis of bilateral carotid arteries: Secondary | ICD-10-CM | POA: Diagnosis not present

## 2020-07-10 DIAGNOSIS — M1990 Unspecified osteoarthritis, unspecified site: Secondary | ICD-10-CM | POA: Diagnosis not present

## 2020-07-10 DIAGNOSIS — I6509 Occlusion and stenosis of unspecified vertebral artery: Secondary | ICD-10-CM | POA: Diagnosis not present

## 2020-07-10 DIAGNOSIS — Z9119 Patient's noncompliance with other medical treatment and regimen: Secondary | ICD-10-CM | POA: Diagnosis not present

## 2020-07-10 DIAGNOSIS — M4802 Spinal stenosis, cervical region: Secondary | ICD-10-CM | POA: Diagnosis not present

## 2020-07-10 DIAGNOSIS — I7090 Unspecified atherosclerosis: Secondary | ICD-10-CM | POA: Diagnosis not present

## 2020-07-18 DIAGNOSIS — H6690 Otitis media, unspecified, unspecified ear: Secondary | ICD-10-CM | POA: Diagnosis not present

## 2020-07-18 DIAGNOSIS — H6121 Impacted cerumen, right ear: Secondary | ICD-10-CM | POA: Diagnosis not present

## 2020-07-18 DIAGNOSIS — E7800 Pure hypercholesterolemia, unspecified: Secondary | ICD-10-CM | POA: Diagnosis not present

## 2020-07-18 DIAGNOSIS — E1165 Type 2 diabetes mellitus with hyperglycemia: Secondary | ICD-10-CM | POA: Diagnosis not present

## 2020-07-18 DIAGNOSIS — Z79899 Other long term (current) drug therapy: Secondary | ICD-10-CM | POA: Diagnosis not present

## 2020-07-19 DIAGNOSIS — Z794 Long term (current) use of insulin: Secondary | ICD-10-CM | POA: Diagnosis not present

## 2020-07-19 DIAGNOSIS — E1165 Type 2 diabetes mellitus with hyperglycemia: Secondary | ICD-10-CM | POA: Diagnosis not present

## 2020-07-20 ENCOUNTER — Ambulatory Visit: Payer: Medicare Other | Admitting: Podiatry

## 2020-08-11 DIAGNOSIS — K5900 Constipation, unspecified: Secondary | ICD-10-CM | POA: Diagnosis not present

## 2020-08-15 DIAGNOSIS — I70213 Atherosclerosis of native arteries of extremities with intermittent claudication, bilateral legs: Secondary | ICD-10-CM | POA: Diagnosis not present

## 2020-08-15 DIAGNOSIS — E1159 Type 2 diabetes mellitus with other circulatory complications: Secondary | ICD-10-CM | POA: Diagnosis not present

## 2020-08-15 DIAGNOSIS — R002 Palpitations: Secondary | ICD-10-CM | POA: Diagnosis not present

## 2020-08-15 DIAGNOSIS — R5383 Other fatigue: Secondary | ICD-10-CM | POA: Diagnosis not present

## 2020-08-25 DIAGNOSIS — K6289 Other specified diseases of anus and rectum: Secondary | ICD-10-CM | POA: Diagnosis not present

## 2020-08-25 DIAGNOSIS — R198 Other specified symptoms and signs involving the digestive system and abdomen: Secondary | ICD-10-CM | POA: Diagnosis not present

## 2020-08-28 DIAGNOSIS — K5730 Diverticulosis of large intestine without perforation or abscess without bleeding: Secondary | ICD-10-CM | POA: Diagnosis not present

## 2020-08-28 DIAGNOSIS — R198 Other specified symptoms and signs involving the digestive system and abdomen: Secondary | ICD-10-CM | POA: Diagnosis not present

## 2020-08-28 DIAGNOSIS — K6289 Other specified diseases of anus and rectum: Secondary | ICD-10-CM | POA: Diagnosis not present

## 2020-09-05 DIAGNOSIS — H43812 Vitreous degeneration, left eye: Secondary | ICD-10-CM | POA: Diagnosis not present

## 2020-09-07 DIAGNOSIS — R198 Other specified symptoms and signs involving the digestive system and abdomen: Secondary | ICD-10-CM | POA: Diagnosis not present

## 2020-09-07 DIAGNOSIS — K6289 Other specified diseases of anus and rectum: Secondary | ICD-10-CM | POA: Diagnosis not present

## 2020-10-02 DIAGNOSIS — Z23 Encounter for immunization: Secondary | ICD-10-CM | POA: Diagnosis not present

## 2020-10-02 DIAGNOSIS — Z1283 Encounter for screening for malignant neoplasm of skin: Secondary | ICD-10-CM | POA: Diagnosis not present

## 2020-10-02 DIAGNOSIS — M8589 Other specified disorders of bone density and structure, multiple sites: Secondary | ICD-10-CM | POA: Diagnosis not present

## 2020-10-02 DIAGNOSIS — Z0000 Encounter for general adult medical examination without abnormal findings: Secondary | ICD-10-CM | POA: Diagnosis not present

## 2020-10-02 DIAGNOSIS — Z1239 Encounter for other screening for malignant neoplasm of breast: Secondary | ICD-10-CM | POA: Diagnosis not present

## 2020-10-02 DIAGNOSIS — Z1159 Encounter for screening for other viral diseases: Secondary | ICD-10-CM | POA: Diagnosis not present

## 2020-10-02 DIAGNOSIS — Z7189 Other specified counseling: Secondary | ICD-10-CM | POA: Diagnosis not present

## 2020-10-02 DIAGNOSIS — Z1211 Encounter for screening for malignant neoplasm of colon: Secondary | ICD-10-CM | POA: Diagnosis not present

## 2020-10-02 DIAGNOSIS — Z13820 Encounter for screening for osteoporosis: Secondary | ICD-10-CM | POA: Diagnosis not present

## 2020-10-05 DIAGNOSIS — Z1283 Encounter for screening for malignant neoplasm of skin: Secondary | ICD-10-CM | POA: Diagnosis not present

## 2020-10-05 DIAGNOSIS — D1801 Hemangioma of skin and subcutaneous tissue: Secondary | ICD-10-CM | POA: Diagnosis not present

## 2020-10-05 DIAGNOSIS — L400 Psoriasis vulgaris: Secondary | ICD-10-CM | POA: Diagnosis not present

## 2020-10-05 DIAGNOSIS — Z08 Encounter for follow-up examination after completed treatment for malignant neoplasm: Secondary | ICD-10-CM | POA: Diagnosis not present

## 2020-10-05 DIAGNOSIS — L821 Other seborrheic keratosis: Secondary | ICD-10-CM | POA: Diagnosis not present

## 2020-10-05 DIAGNOSIS — L814 Other melanin hyperpigmentation: Secondary | ICD-10-CM | POA: Diagnosis not present

## 2020-10-05 DIAGNOSIS — Z85820 Personal history of malignant melanoma of skin: Secondary | ICD-10-CM | POA: Diagnosis not present

## 2020-10-09 DIAGNOSIS — L409 Psoriasis, unspecified: Secondary | ICD-10-CM | POA: Diagnosis not present

## 2020-10-10 DIAGNOSIS — Z1231 Encounter for screening mammogram for malignant neoplasm of breast: Secondary | ICD-10-CM | POA: Diagnosis not present

## 2020-10-17 DIAGNOSIS — E785 Hyperlipidemia, unspecified: Secondary | ICD-10-CM | POA: Diagnosis not present

## 2020-10-17 DIAGNOSIS — E119 Type 2 diabetes mellitus without complications: Secondary | ICD-10-CM | POA: Diagnosis not present

## 2020-10-17 DIAGNOSIS — Z1159 Encounter for screening for other viral diseases: Secondary | ICD-10-CM | POA: Diagnosis not present

## 2020-10-17 DIAGNOSIS — Z0000 Encounter for general adult medical examination without abnormal findings: Secondary | ICD-10-CM | POA: Diagnosis not present

## 2020-10-18 DIAGNOSIS — Z794 Long term (current) use of insulin: Secondary | ICD-10-CM | POA: Diagnosis not present

## 2020-10-18 DIAGNOSIS — E1165 Type 2 diabetes mellitus with hyperglycemia: Secondary | ICD-10-CM | POA: Diagnosis not present

## 2020-10-24 DIAGNOSIS — L600 Ingrowing nail: Secondary | ICD-10-CM | POA: Diagnosis not present

## 2020-10-24 DIAGNOSIS — B351 Tinea unguium: Secondary | ICD-10-CM | POA: Diagnosis not present

## 2020-11-09 DIAGNOSIS — R1031 Right lower quadrant pain: Secondary | ICD-10-CM | POA: Diagnosis not present

## 2020-11-09 DIAGNOSIS — K640 First degree hemorrhoids: Secondary | ICD-10-CM | POA: Diagnosis not present

## 2020-11-09 DIAGNOSIS — K6289 Other specified diseases of anus and rectum: Secondary | ICD-10-CM | POA: Diagnosis not present

## 2020-11-09 DIAGNOSIS — R10813 Right lower quadrant abdominal tenderness: Secondary | ICD-10-CM | POA: Diagnosis not present

## 2020-11-21 DIAGNOSIS — N200 Calculus of kidney: Secondary | ICD-10-CM | POA: Diagnosis not present

## 2021-10-10 IMAGING — MG MM BREAST BX W/ LOC DEV 1ST LESION IMAGE BX SPEC STEREO GUIDE*R*
8 of 11 series · 8 of 23 positions shown · non-contrast
Comparison: Previous exams.
COMPARISON: Previous exams.

Addendum:
CLINICAL DATA: 84-year-old female presenting for stereotactic
biopsy of a subtle right breast distortion.

EXAM:
RIGHT BREAST STEREOTACTIC CORE NEEDLE BIOPSY

[R (1 of 7)]
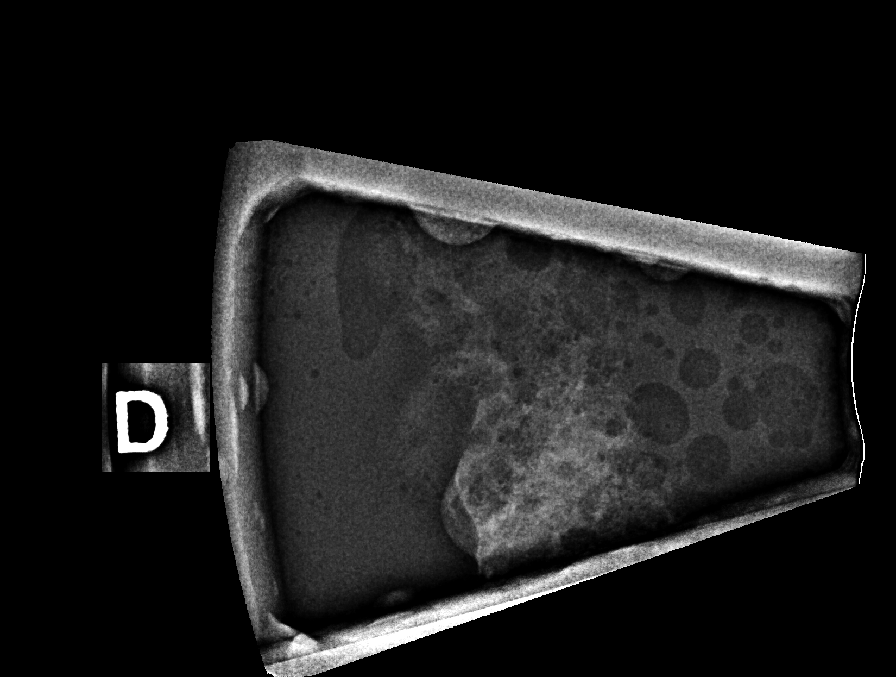

[R (2 of 7)]
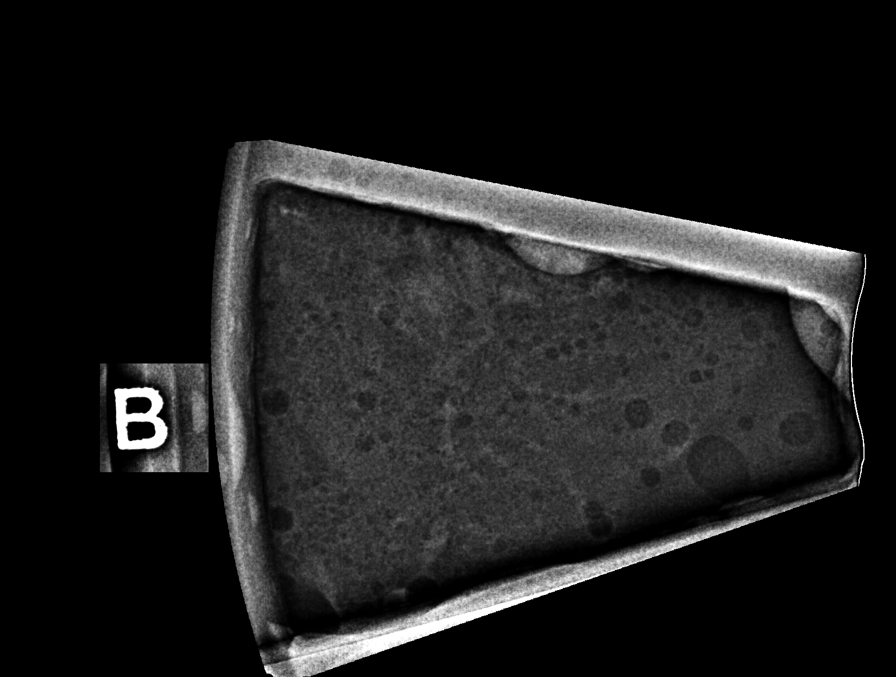

[R (3 of 7)]
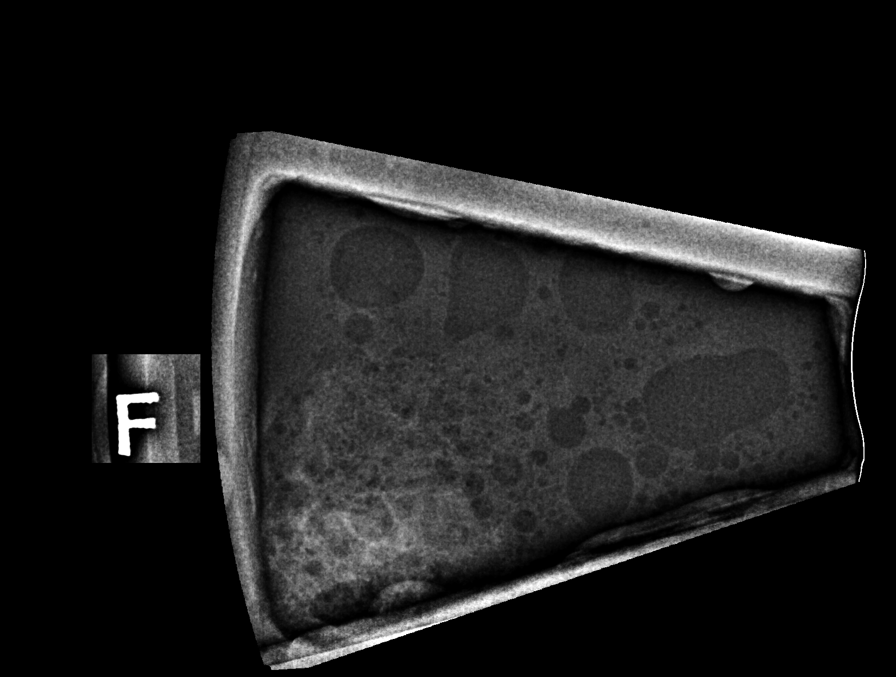

[R (4 of 7)]
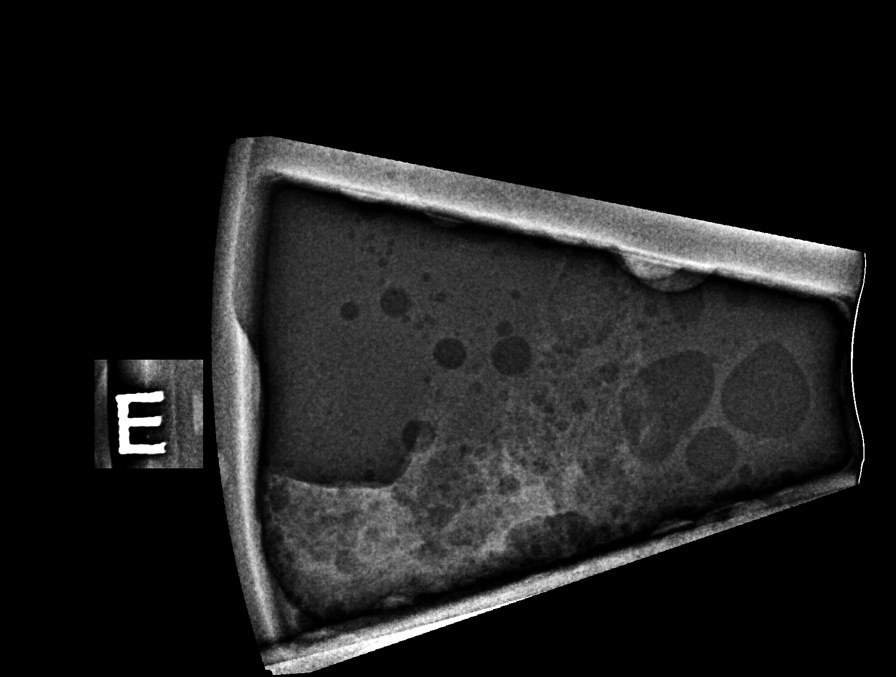

[R (5 of 7)]
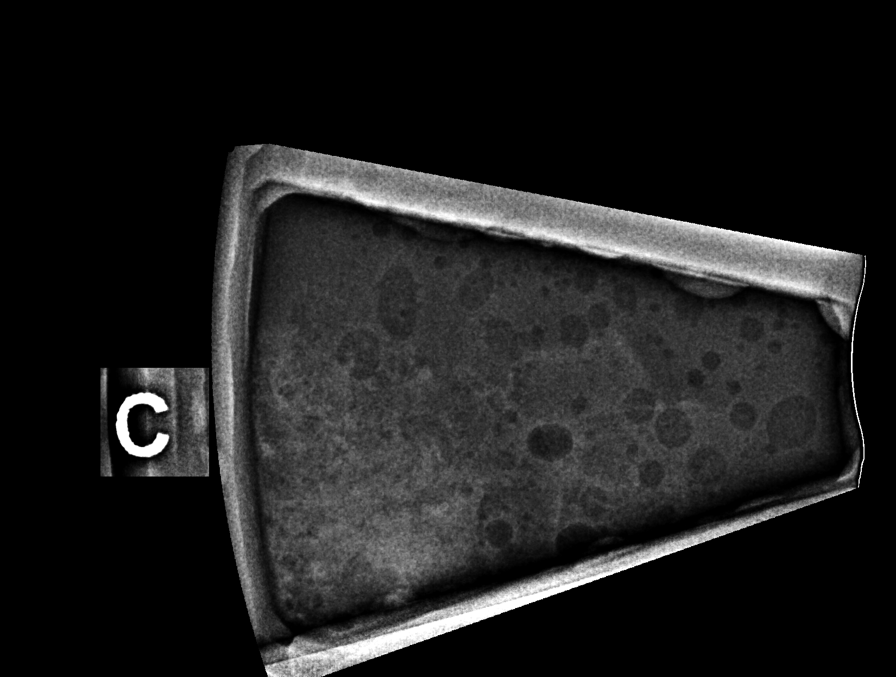

[R (6 of 7)]
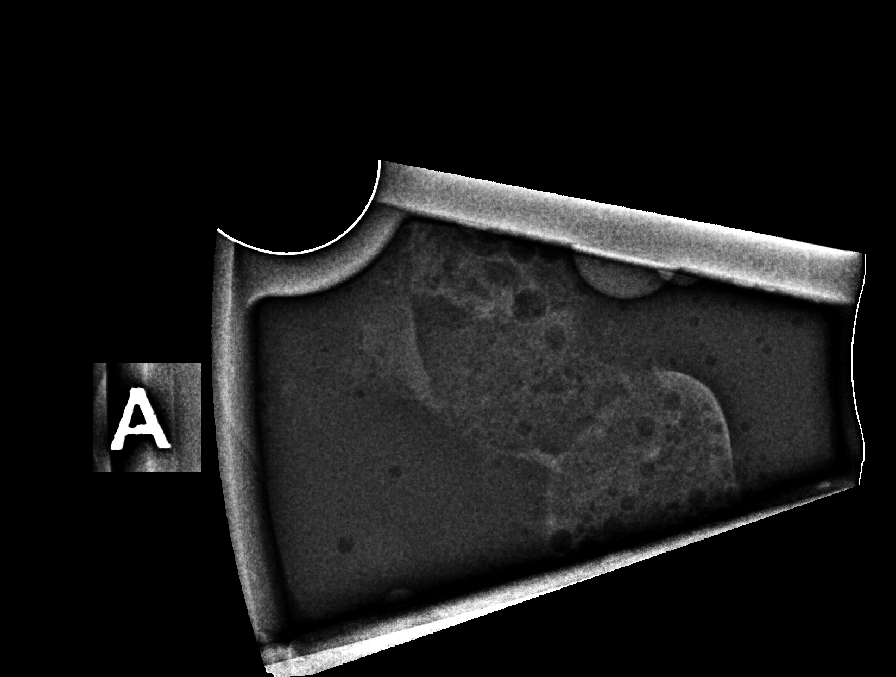

[R (7 of 7)]
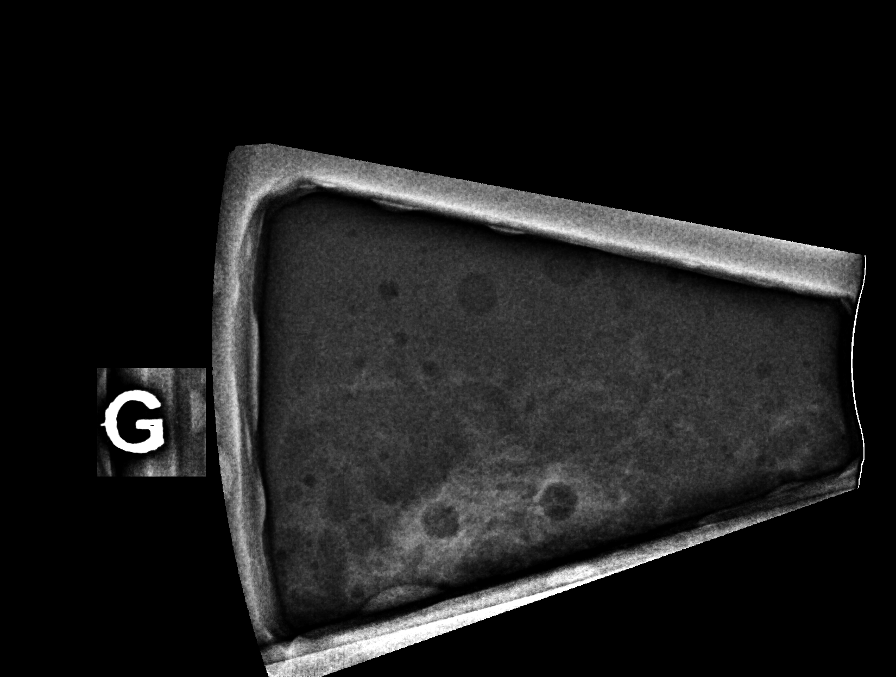

[R LM]
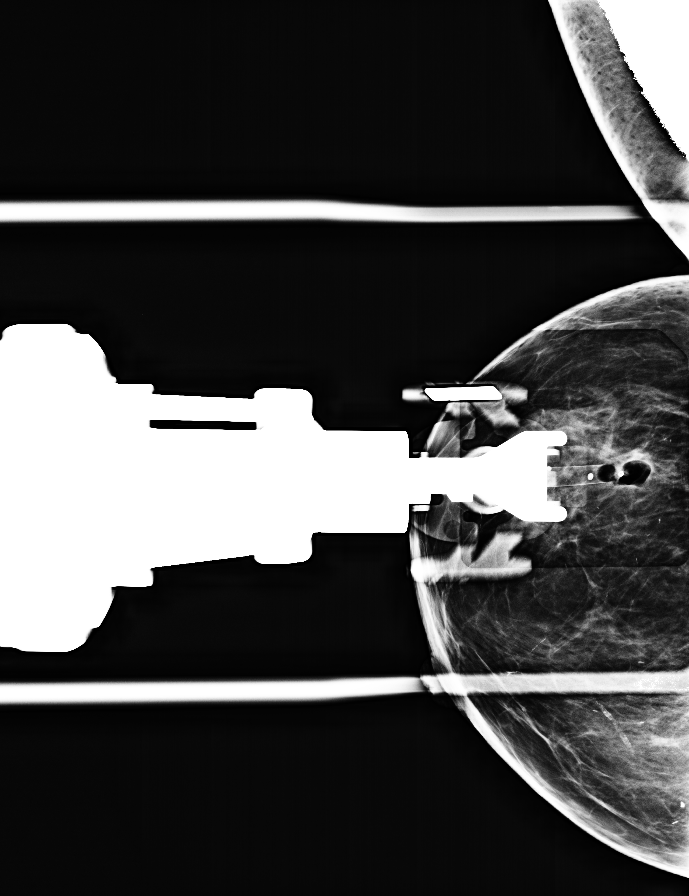

[8 of 23 positions shown; findings below may reference images not displayed]



Using sterile technique and 1% Lidocaine as local anesthetic, under
stereotactic guidance, a 9 gauge vacuum assisted device was used to
perform core needle biopsy of subtle distortion in the lower outer
right breast using a lateral approach.

Lesion quadrant: Lower outer quadrant

At the conclusion of the procedure, coil shaped tissue marker clip
was deployed into the biopsy cavity. Follow-up 2-view mammogram was
performed and dictated separately.
IMPRESSION: Stereotactic-guided biopsy of subtle distortion in the lower outer
right breast. No apparent complications.

ADDENDUM:
Pathology revealed FIBROCYSTIC CHANGE of the Right breast, lower
outer. This was found to be concordant by Dr. Shyryn Polatova.

Pathology results were discussed with the patient and her husband by
telephone. The patient reported doing well after the biopsy with
tenderness and minimal bleeding at the site. Post biopsy
instructions and care were reviewed and questions were answered. The
patient was encouraged to call [REDACTED] for any additional concerns.

The patient was instructed to return for Right diagnostic
mammography in 6 months at [facility] in [HOSPITAL][HOSPITAL].

Pathology results reported by Enedelia Cha, RN on 09/17/2019.



Using sterile technique and 1% Lidocaine as local anesthetic, under
stereotactic guidance, a 9 gauge vacuum assisted device was used to
perform core needle biopsy of subtle distortion in the lower outer
right breast using a lateral approach.

Lesion quadrant: Lower outer quadrant

At the conclusion of the procedure, coil shaped tissue marker clip
was deployed into the biopsy cavity. Follow-up 2-view mammogram was
performed and dictated separately.
IMPRESSION: Stereotactic-guided biopsy of subtle distortion in the lower outer
right breast. No apparent complications.

## 2021-10-10 IMAGING — MG MM BREAST LOCALIZATION CLIP
4 series · 4 of 12 positions shown · non-contrast
Comparison: Previous exam(s).

CLINICAL DATA: Post biopsy mammogram of the right breast for clip
placement.

EXAM:
DIAGNOSTIC RIGHT MAMMOGRAM POST STEREOTACTIC BIOPSY

[R LM synth-2D]
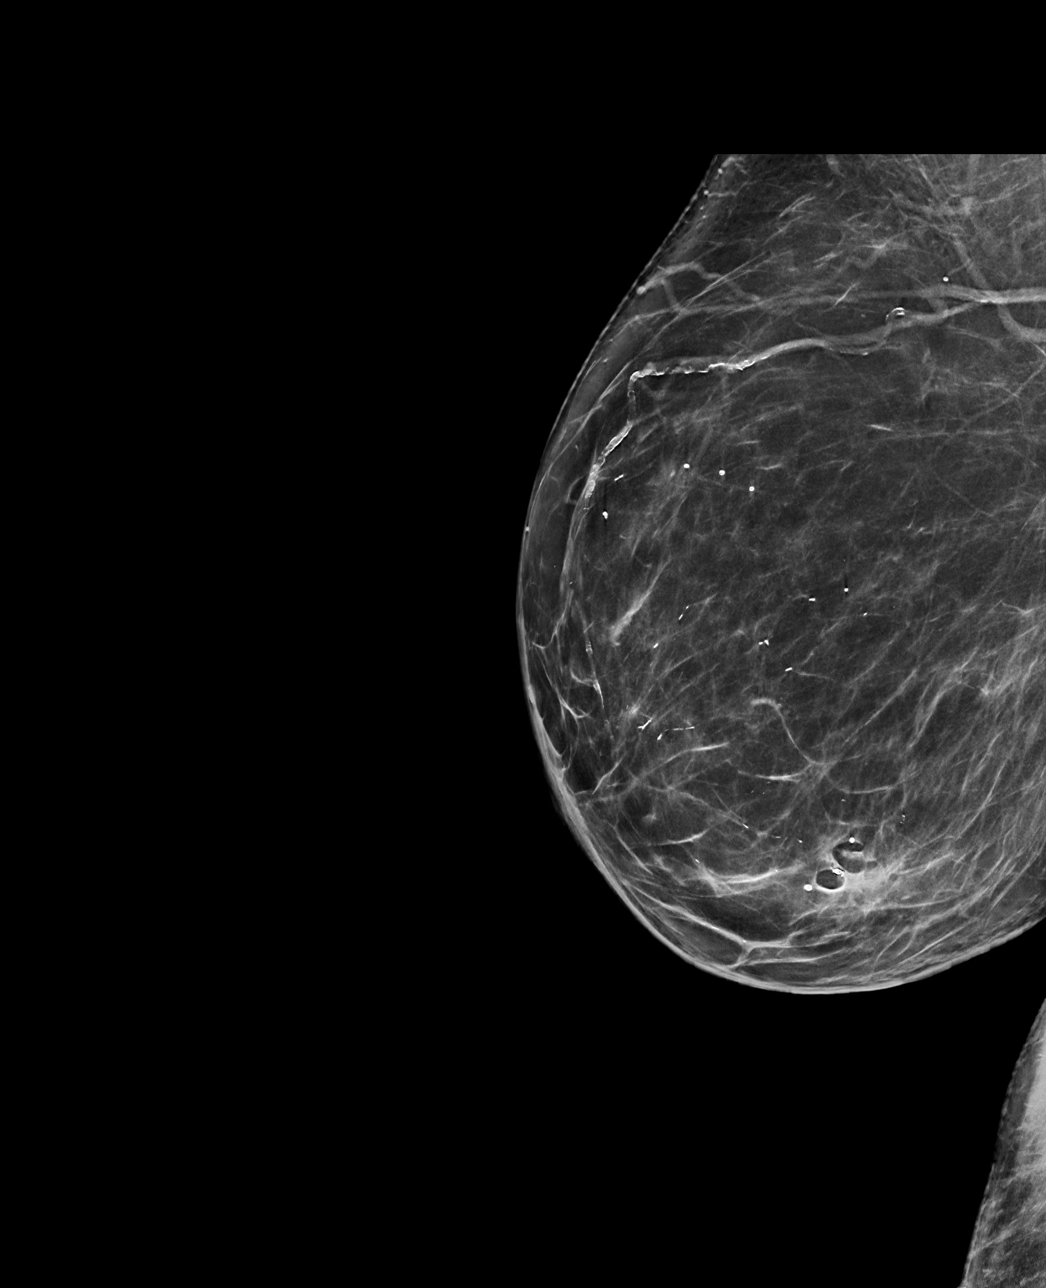

[R CC synth-2D]
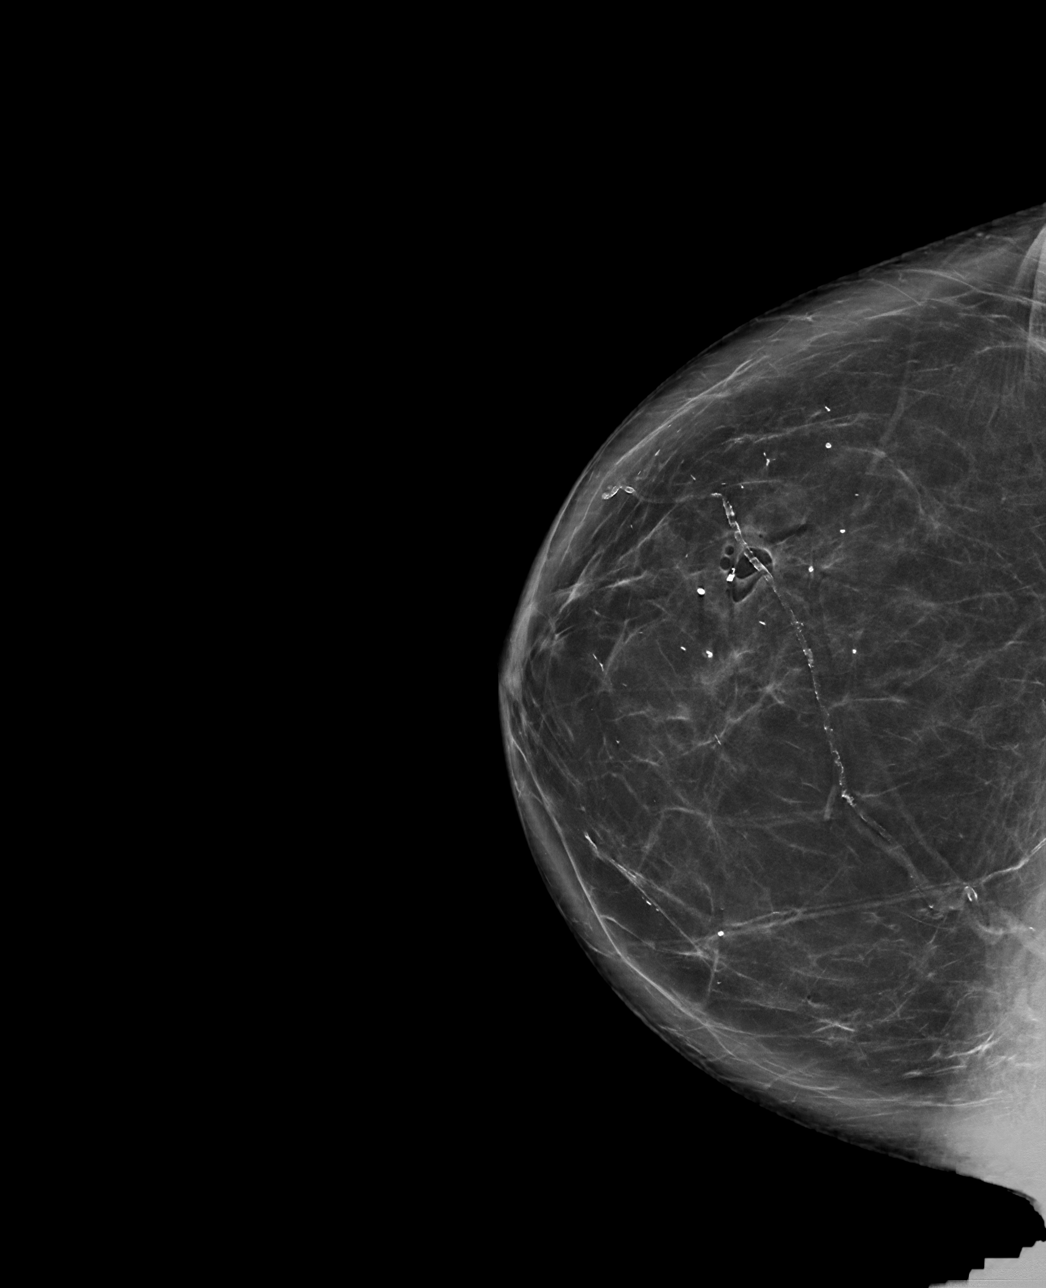

[R LM tomo · tomo slice 44/87.0]
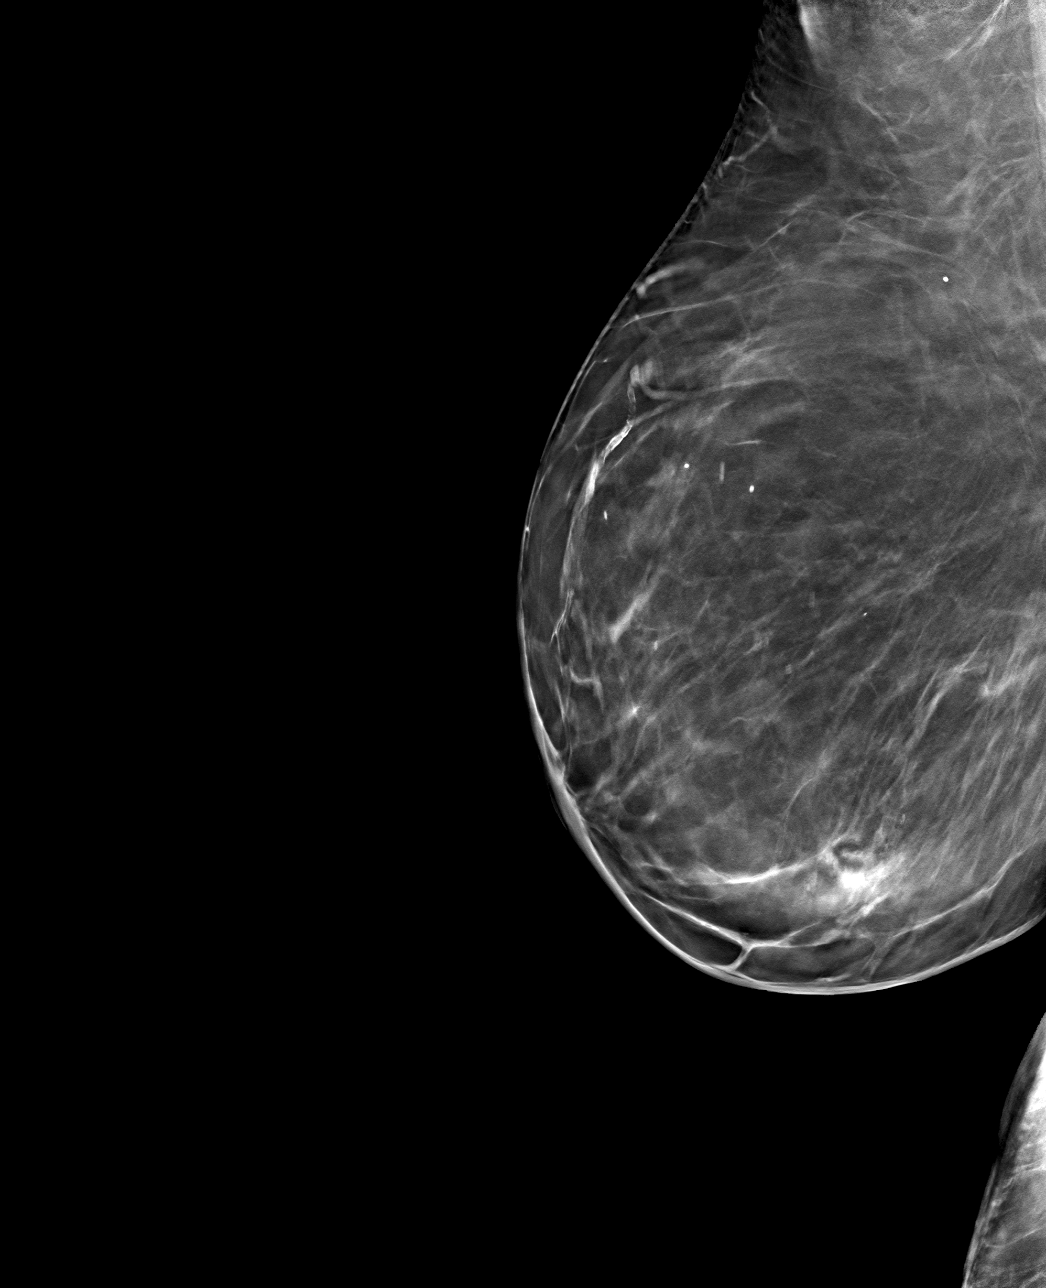

[R CC tomo · tomo slice 47/92.0]
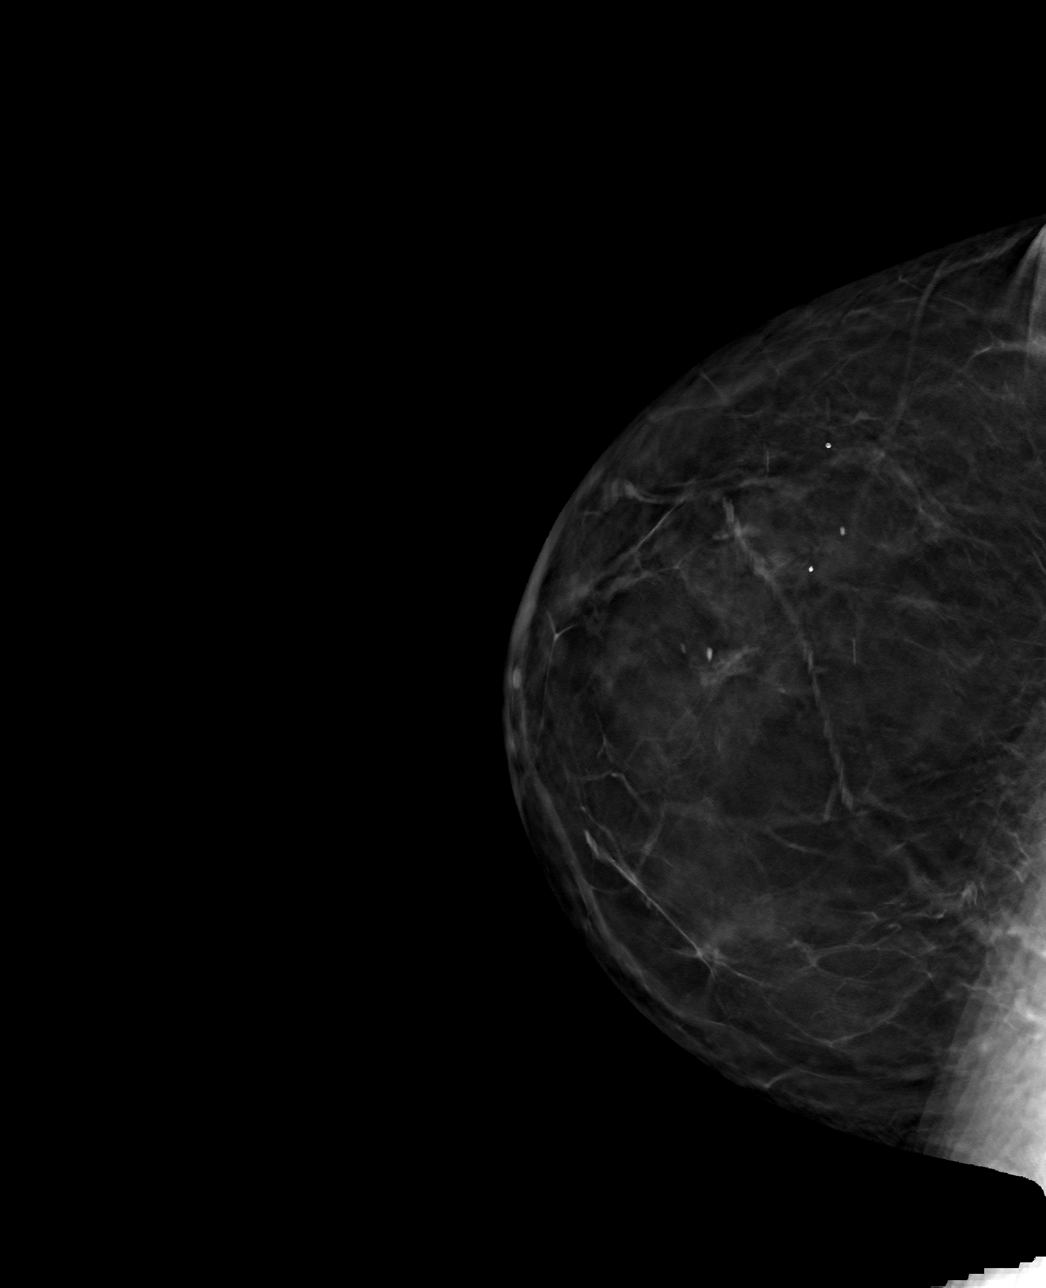

[4 of 12 positions shown; findings below may reference images not displayed]

FINDINGS: Mammographic images were obtained following stereotactic guided
biopsy of subtle distortion in the lower outer right breast. The
biopsy marking clip is in expected position at the site of biopsy.
IMPRESSION: Appropriate positioning of the coil shaped biopsy marking clip at
the site of biopsy in the lower outer right breast.

Final Assessment: Post Procedure Mammograms for Marker Placement

## 2023-03-18 ENCOUNTER — Other Ambulatory Visit (HOSPITAL_COMMUNITY): Payer: Self-pay
